# Patient Record
Sex: Male | Born: 1955 | Race: White | Hispanic: No | Marital: Married | Smoking: Former smoker
Health system: Southern US, Community
[De-identification: ages and names within clinical notes are randomized; demographics above are authoritative.]

## PROBLEM LIST (undated history)

## (undated) DIAGNOSIS — E039 Hypothyroidism, unspecified: Secondary | ICD-10-CM

## (undated) DIAGNOSIS — I10 Essential (primary) hypertension: Secondary | ICD-10-CM

## (undated) DIAGNOSIS — G4730 Sleep apnea, unspecified: Secondary | ICD-10-CM

## (undated) DIAGNOSIS — M1990 Unspecified osteoarthritis, unspecified site: Secondary | ICD-10-CM

## (undated) DIAGNOSIS — I499 Cardiac arrhythmia, unspecified: Secondary | ICD-10-CM

## (undated) HISTORY — PX: COLONOSCOPY: SHX174

## (undated) HISTORY — PX: CARDIAC CATHETERIZATION: SHX172

---

## 1993-10-12 HISTORY — PX: KNEE ARTHROSCOPY: SHX127

## 1996-10-12 HISTORY — PX: UVULOPALATOPHARYNGOPLASTY, TONSILLECTOMY AND SEPTOPLASTY: SHX2632

## 1998-10-12 HISTORY — PX: KNEE ARTHROSCOPY: SUR90

## 2003-04-11 ENCOUNTER — Emergency Department (HOSPITAL_COMMUNITY): Admission: EM | Admit: 2003-04-11 | Discharge: 2003-04-12 | Payer: Self-pay | Admitting: Emergency Medicine

## 2003-10-13 HISTORY — PX: SHOULDER ARTHROSCOPY: SHX128

## 2003-11-09 ENCOUNTER — Encounter: Admission: RE | Admit: 2003-11-09 | Discharge: 2003-11-09 | Payer: Self-pay

## 2004-09-02 ENCOUNTER — Encounter: Admission: RE | Admit: 2004-09-02 | Discharge: 2004-09-02 | Payer: Self-pay

## 2005-09-14 ENCOUNTER — Inpatient Hospital Stay (HOSPITAL_COMMUNITY): Admission: EM | Admit: 2005-09-14 | Discharge: 2005-09-19 | Payer: Self-pay | Admitting: Emergency Medicine

## 2005-09-15 ENCOUNTER — Encounter (INDEPENDENT_AMBULATORY_CARE_PROVIDER_SITE_OTHER): Payer: Self-pay | Admitting: *Deleted

## 2005-09-16 ENCOUNTER — Ambulatory Visit: Payer: Self-pay | Admitting: Internal Medicine

## 2005-10-09 ENCOUNTER — Observation Stay (HOSPITAL_COMMUNITY): Admission: EM | Admit: 2005-10-09 | Discharge: 2005-10-10 | Payer: Self-pay | Admitting: Emergency Medicine

## 2005-10-30 ENCOUNTER — Encounter (HOSPITAL_COMMUNITY): Admission: RE | Admit: 2005-10-30 | Discharge: 2006-01-28 | Payer: Self-pay | Admitting: Endocrinology

## 2007-05-31 ENCOUNTER — Ambulatory Visit (HOSPITAL_COMMUNITY): Admission: RE | Admit: 2007-05-31 | Discharge: 2007-05-31 | Payer: Self-pay | Admitting: Orthopedic Surgery

## 2010-06-09 ENCOUNTER — Ambulatory Visit: Payer: Self-pay | Admitting: Diagnostic Radiology

## 2010-06-09 ENCOUNTER — Emergency Department (HOSPITAL_BASED_OUTPATIENT_CLINIC_OR_DEPARTMENT_OTHER): Admission: EM | Admit: 2010-06-09 | Discharge: 2010-06-10 | Payer: Self-pay | Admitting: Emergency Medicine

## 2010-12-26 LAB — DIFFERENTIAL
Basophils Absolute: 0.1 10*3/uL (ref 0.0–0.1)
Basophils Relative: 1 % (ref 0–1)
Eosinophils Absolute: 0.1 10*3/uL (ref 0.0–0.7)
Eosinophils Relative: 1 % (ref 0–5)
Lymphocytes Relative: 13 % (ref 12–46)
Lymphs Abs: 1.1 10*3/uL (ref 0.7–4.0)
Monocytes Absolute: 0.5 10*3/uL (ref 0.1–1.0)
Monocytes Relative: 6 % (ref 3–12)
Neutro Abs: 6.7 10*3/uL (ref 1.7–7.7)
Neutrophils Relative %: 79 % — ABNORMAL HIGH (ref 43–77)

## 2010-12-26 LAB — COMPREHENSIVE METABOLIC PANEL
ALT: 16 U/L (ref 0–53)
AST: 22 U/L (ref 0–37)
Albumin: 4.2 g/dL (ref 3.5–5.2)
Alkaline Phosphatase: 73 U/L (ref 39–117)
BUN: 20 mg/dL (ref 6–23)
CO2: 27 mEq/L (ref 19–32)
Calcium: 9.1 mg/dL (ref 8.4–10.5)
Chloride: 105 mEq/L (ref 96–112)
Creatinine, Ser: 1.2 mg/dL (ref 0.4–1.5)
GFR calc Af Amer: >60 mL/min (ref >60)
GFR calc non Af Amer: >60 mL/min (ref >60)
Glucose, Bld: 140 mg/dL — ABNORMAL HIGH (ref 70–99)
Potassium: 3.9 mEq/L (ref 3.5–5.1)
Sodium: 143 mEq/L (ref 135–145)
Total Bilirubin: 0.7 mg/dL (ref 0.3–1.2)
Total Protein: 7.4 g/dL (ref 6.0–8.3)

## 2010-12-26 LAB — CBC
HCT: 44.7 % (ref 39.0–52.0)
Hemoglobin: 15.5 g/dL (ref 13.0–17.0)
MCH: 32.4 pg (ref 26.0–34.0)
MCHC: 34.7 g/dL (ref 30.0–36.0)
MCV: 93.5 fL (ref 78.0–100.0)
Platelets: 188 10*3/uL (ref 150–400)
RBC: 4.78 MIL/uL (ref 4.22–5.81)
RDW: 12.3 % (ref 11.5–15.5)
WBC: 8.5 10*3/uL (ref 4.0–10.5)

## 2010-12-26 LAB — URINALYSIS, ROUTINE W REFLEX MICROSCOPIC
Bilirubin Urine: NEGATIVE
Glucose, UA: 100 mg/dL — AB
Hgb urine dipstick: NEGATIVE
Ketones, ur: NEGATIVE mg/dL
Nitrite: NEGATIVE
Protein, ur: NEGATIVE mg/dL
Specific Gravity, Urine: 1.023 (ref 1.005–1.030)
Urobilinogen, UA: 0.2 mg/dL (ref 0.0–1.0)
pH: 5 (ref 5.0–8.0)

## 2010-12-26 LAB — LIPASE, BLOOD: Lipase: 39 U/L (ref 23–300)

## 2011-02-27 NOTE — Consult Note (Signed)
NAME:  Theodore Moore, Theodore Moore NO.:  [id number]   MEDICAL RECORD NO.:  [id number]          PATIENT TYPE:  INP   LOCATION:  3708                         FACILITY:  [facility]   PHYSICIAN:  Tera Mater. Evlyn Kanner, M.D. DATE OF BIRTH:  May 07, 1956   DATE OF CONSULTATION:  09/18/2005  DATE OF DISCHARGE:                                   CONSULTATION   CONSULTING PHYSICIAN:  Tera Mater. Evlyn Kanner, M.D.   REQUESTING PHYSICIANS:  1.  Duke Salvia, M.D.  2.  Windy Fast D. Polite, M.D.   Theodore Moore is a 55 year old white male admitted, on September 14, 2005,  with new onset atrial fibrillation with a rapid ventricular response.  He  has had a prior history of dyslipidemia, hypertension, and sleep apnea.  He  never had any thyroid history.  As a part of his workup, his thyroid testing  was done showing a remarkably elevated thyroid function test.  I am asked to  see him for this reason.  Upon questioning, the patient reports a stable  weight for the last 6-12 months.  He does note some tremor of his hands  noted for about the last 6-8 weeks which he attributed to his blood pressure  treatment.  His sleep has been chronically disordered but has not changed  any recently.  He has had no sensation of weakness over his baseline.  He  has been a bit constipated.  He has had no discrete muscle weakness.  He has  had no diplopia or eye irritation.  He does have some chronic phlegm in his  throat causing some swallowing difficulty intermittently but nothing that  sounds like a goiter related swallowing difficulty.  He had no palpitations  prior to this.  His overall status has been good.  He has noted no skin  issues, headaches, or neurologic complaints.   SOCIAL HISTORY:  Reveals he is divorced.  He has not smoked in 25 years.  He  does use occasional alcohol.   FAMILY HISTORY:  Reveals his father died of an MI at 45.  Mother died of an  aneurysm.  A brother committed suicide, another brother had an  automobile  accident.   MEDICATIONS:  Here were Benicar 40 mg, aspirin, and calcium carbonate.  He  also takes Viagra and fish oil.   PHYSICAL EXAMINATION:  VITAL SIGNS:  Currently, blood pressure 117/72, pulse  67, respirations 18, temperature 97.9, sat is 98% on room air.  GENERAL:  We  have a fairly healthy, tanned-appearing, white male lying quietly in bed in  no distress.  HEENT:  Sclerae are anicteric.  There is no lid lag.  No exophthalmus and no  extraocular muscle dysfunction.  Oral mucous membranes are moist.  NECK:  Supple.  I can not appreciate a significant goiter.  No bruits are  heard.  LUNGS:  Clear.  HEART:  Regular with no murmurs appreciated.  ABDOMEN:  Soft, nontender.  I can not feel any spleen or liver enlargement.  EXTREMITIES:  Reveal pulses to be intact.  There is no pretibial skin  changes.  There is no onycholysis  noted of his fingernails.  NEUROLOGIC:  The patient is awake, alert.  His mentation is clear.  His  voice is strong with no hoarseness.  There is a trace resting tremor.   LABORATORY DATA:  White count is 6,000, hemoglobin 15.9, MCV 84.6, platelets  are 228,000.  INR now is 1.7.  His chemistries:  Sodium 140, potassium 4.2,  chloride 107, CO2 16, glucose 107, creatinine 1.0.  Total bili 0.8, alkaline  phosphatase 99, SGOT 26, SGPT elevated at 60, total protein 5.9, albumin  3.5, calcium 9.5.  CK was 57 with an MB of 1.5, troponin was 0.01.  Total  cholesterol was 171, triglycerides 207, HDL 43, LDL of 87.  Free T4 is  significantly high at 3.82 with normal to 1.80.  TSH is depressed at 0.05.  Free T4 is 9.4 with normal range up to 4.2.  Thyroglobulin antibodies are  clearly positive at 102, thyroid peroxidase antibodies 32.6 which is  intermediate.  Thyroid scan is in progress.   SUMMARY:  We have a 55 year old white male presenting with new onset Afib in  the setting of abnormal thyroid function testing.  He has somewhat of a  mixed picture  without significant symptomatology other than tremor for the  last 6-8 weeks.  It is certainly possible that this represents a thyroiditis  that would be self-resolving.  Scan and uptake will be very important as a  diagnostic tool to help [place] decided whether oral antithyroid drugs are  indicated.  The other variable in this equation is that his liver function  testing was mildly elevated and this will be repeated.  We will have to be  more careful with antithyroid drugs if the LFTs indeed remain elevated.  Workup of this will be as clinically indicated by the new numbers.  I do  agree with the anticoagulation.  His risk for recurrent arrhythmia is  relatively high until his thyroid status is improved.  He has no thyroid,  eye, or skin manifestations.  Of note as well is he does have some  dyslipidemia that probably needs routine addressing.           ______________________________  Tera Mater. Evlyn Kanner, M.D.     SAS/MEDQ  D:  09/18/2005  T:  09/18/2005  Job:  [id number]

## 2011-02-27 NOTE — Consult Note (Signed)
NAMEMarland Kitchen  NELS, MUNN NO.:  [id number]   MEDICAL RECORD NO.:  [id number]          PATIENT TYPE:  INP   LOCATION:  2902                         FACILITY:  [facility]   PHYSICIAN:  Theone Stanley, MD   DATE OF BIRTH:  1956/08/15   DATE OF CONSULTATION:  09/15/2005  DATE OF DISCHARGE:                                   CONSULTATION   REASON FOR CONSULTATION:  Evidence of hyperthyroidism.   REQUESTING PHYSICIAN:  Dr. Fraser Din   HISTORY OF PRESENT ILLNESS:  Mr. Dunsworth is a very pleasant 55 year old  gentleman who states on Friday prior to his admission he had an episode of  chest tightness.  In addition, some feelings of hot flashes.  This persisted  throughout the day.  In addition, he states he felt like it was heartburn.  These symptoms seemed to have resolve and he was able to engage in his  normal activities.  However, on Monday he woke up again feeling as if he had  heartburn and overall not feeling well.  He attempted to take his blood  pressure and he has noted that his heart was erratic.  He presented to Dr.  Pablo Lawrence office who referred him to the emergency room.  In the emergency  room it was noted patient was in atrial fibrillation with RVR.  Heart rate  ranging from 150-180.  He was started on a Cardizem drip and a bolus with  quite difficult to control.  Because of this reason cardiology admitted the  patient.  During his work-up it was noted that his TSH was 0.005.  Because  of this reason, cardiology asked [place] to come by and assess him and engage in  further work-up.  Currently, patient states that his symptoms have resolved  and he has no longer any evidence of hot flashes or any heartburn.  He  states that there is no family history of hypothyroidism.  He denies any  change in his skin or hair, any change in his bowel habits, any peripheral  edema.   PAST MEDICAL HISTORY:  1.  Sleep apnea.  Apparently, he is noncompliant.  2.  Hypertension.  3.   Dyslipidemia.  4.  Patient has a history of myalgias.  He was unable to give me a specific      diagnosis.  He sees Dr. Phylliss Bob in regards to this.  He has been on      prednisone for the past year and a half.  He was originally on 10 mg and      now he is on __________ mg daily.   PAST SURGICAL HISTORY:  Left shoulder surgery in 2006.   SOCIAL HISTORY:  Patient lives in [place].  Has been divorced.  Has a  history of tobacco use, stopped 25 years ago.  Drinks about a six-pack every  one to two weeks.  Has one son.  He works as a Curator.   FAMILY HISTORY:  Father passed away from an MI at age 68.  Mother had a  ruptured aneurysm.  He has one brother who committed suicide, one brother  who passed from an automobile accident.  His son also has a history of  myalgias and he had been seeing Dr. Phylliss Bob.  Again, there is no specific  diagnosis for this.   REVIEW OF SYSTEMS:  Please see HPI, otherwise all systems were negative.   PHYSICAL EXAMINATION:  VITAL SIGNS:  Blood pressure 95/65, heart rate of 75-  85, respiratory rate of 15, saturating 97% on room air.  GENERAL:  Very pleasant 55 year old gentleman laying in bed in no distress.  HEENT:  Head normocephalic, atraumatic.  Eyes:  3 mm, pupils reactive to  light.  Extraocular muscles intact.  Ears no discharge.  Throat clear.  No  erythema.  No exudate.  Mucosa moist.  NECK:  Supple.  No lymphadenopathy.  No JVD.  No thyromegaly.  HEART:  Irregularly irregular.  No murmurs, rubs, or gallops appreciated.  LUNGS:  Clear to auscultation bilaterally.  ABDOMEN:  Soft, nontender, nondistended.  EXTREMITIES:  No edema, cyanosis or clubbing.  SKIN:  Patient appeared to have evidence of sun exposure.  When inquired  about this he states that he does visit a tanning bed on occasion.  GENITOURINARY:  Deferred.  NEUROLOGIC:  Patient alert and oriented x3.   LABORATORIES:  TSH 0.005.  White count of 6.5, hemoglobin 17, hematocrit 50,  platelets  289.  Sodium 140, potassium 4.2, chloride 107, CO2 27, glucose  107, BUN 16, creatinine 1.  ALT 60, AST 26, albumin 3.5, calcium 9.5,  magnesium 2.3, total bilirubin 0.8, alkaline phosphatase 99, AST 26.  Cardiac enzymes were negative.  EKG consistent with atrial fibrillation.  Patient did have a brief episode of pause extending out to 5.1 seconds.   ASSESSMENT/PLAN:  Mr. Seier presents with atrial fibrillation with rapid  ventricular response and evidence of hyperthyroidism.  1.  Hyperthyroidism.  Will obtain a free T3 and a free T4.  Based on these      results will determine whether to obtain a thyroid radioiodine uptake      scan.  In addition, would recommend beta blockers versus Cardizem.      Patient has no other symptoms at this point in time, therefore the need      for a PTU is not necessary unless he becomes more symptomatic.  I did      discuss the issue with cardiology and because of his five-second pause      it was felt that they would better be able to control his rate with a      Cardizem drip.  I will leave this up to him to decide if and when to      change to a beta blocker.  2.  Myalgias.  Again, it is unclear why patient is on prednisone for this      myalgia.  There is no specific diagnosis the patient can give me.  He      does see Dr. Phylliss Bob for this and he has been on prednisone for about a      year and a half and this is being weaned down.  I will try to obtain the      records from Dr. Renaldo Reel office in an attempt to see if he has specific      diagnosis that might be related to his hyperthyroidism.      Theone Stanley, MD  Electronically Signed     AEJ/MEDQ  D:  09/15/2005  T:  09/15/2005  Job:  [id number]  cc:   Molly Maduro L. Foy [place], M.D.  Fax: [phone]   Areatha Keas, M.D.  Fax: [phone]

## 2011-02-27 NOTE — H&P (Signed)
NAME:  Theodore Moore, Theodore Moore NO.:  [id number]   MEDICAL RECORD NO.:  [id number]          PATIENT TYPE:  INP   LOCATION:  3714                         FACILITY:  [facility]   PHYSICIAN:  Lyn Records, M.D.   DATE OF BIRTH:  05/08/1956   DATE OF ADMISSION:  10/09/2005  DATE OF DISCHARGE:                                HISTORY & PHYSICAL   REASON FOR ADMISSION:  Atrial fibrillation and atrial flutter.   SUBJECTIVE:  The patient is 55 years of age and has a history of atrial  fibrillation/atrial flutter, recently diagnosed on September 14, 2005.  During  that admission, he was found to be hyperthyroid with question of thyroiditis  versus other.  He was discharged from the hospital before the final  interpretation of his thyroid scan was done, and he was supposed to follow  up with endocrine, but this has been not well accomplished.  He converted  from atrial fibrillation/atrial flutter on IV Cardizem and Lopressor, and  ultimately was seen by Dr. Sherryl Manges because of some post-conversion and  peri-conversion bradyarrhythmias.  He has ultimately been doing well as an  outpatient on Lopressor 50 mg b.i.d., Coumadin 5 mg daily, and he also takes  prednisone 5 mg daily for myalgias.   ALLERGIES:  No known drug allergies.   PAST MEDICAL HISTORY:  1.  Obstructive sleep apnea, on CPAP.  2.  Atrial fibrillation/atrial flutter, diagnosed September 14, 2005.  3.  Hyperthyroidism, probably Graves.  4.  Hyperlipidemia.   FAMILY HISTORY:  Father died of myocardial infarction at age 89.  His mother  died of a ruptured aneurysm, I am not sure where.  He has a brother who has  coronary artery disease and another brother that died of suicide.   REVIEW OF SYSTEMS:  Negative for hyperthyroid symptoms.  Negative neuro  symptoms.  No bleeding on Coumadin.  Myalgias continue.   HABITS:  He does not smoke.  Drinks perhaps a six pack of beer per month.   OBJECTIVE:  VITAL SIGNS:  Blood  pressure 120/60, heart rate 160 and regular,  respirations 16 and not labored.  SKIN:  Warm and dry.  HEENT:  No stare or lid lag.  Extraocular movements are full.  NECK:  No JVD, carotid bruit, or thyromegaly.  CHEST:  Clear.  CARDIAC:  Tachy rhythm.  No murmur or gallop is heard.  EXTREMITIES:  No edema.  NEUROLOGIC:  No tremor or focal cranial nerve deficits.  There is no  exophthalmos.   LABORATORY DATA:  ECG reveals atrial flutter with 2:1 AV conduction.  Chest  x-ray reveals basilar atelectasis, otherwise unremarkable.   INR of 2.3, ALT of 60, otherwise liver tests are normal.  Thyroid scan done  three weeks ago reveals an increased iodine uptake at 57%, consistent by the  interpretation of radiologist with Graves disease.   IMPRESSION:  1.  Hyperthyroidism, untreated, probably related to Graves disease.  2.  Atrial flutter with rapid ventricular response.  3.  Hyperlipidemia.  4.  Sleep apnea.   PLAN:  1.  Intravenous Cardizem.  2.  Intravenous and  oral beta blocker therapy.  3.  Recheck thyroid tests and reconsult endocrine.  4.  Continue Coumadin.  5.  Continue CPAP as at home.      Lyn Records, M.D.  Electronically Signed     HWS/MEDQ  D:  10/09/2005  T:  10/10/2005  Job:  [id number]   cc:   Molly Maduro L. Foy [place], M.D.  Fax: [phone]   Tera Mater. Evlyn Kanner, M.D.  Fax: [phone]   Dr. Vernetta Honey [place] Office

## 2011-02-27 NOTE — H&P (Signed)
NAME:  Theodore Moore, Theodore Moore NO.:  [id number]   MEDICAL RECORD NO.:  [id number]          PATIENT TYPE:  INP   LOCATION:  1824                         FACILITY:  [facility]   PHYSICIAN:  Meade Maw, M.D.    DATE OF BIRTH:  October 27, 1955   DATE OF ADMISSION:  09/14/2005  DATE OF DISCHARGE:                                HISTORY & PHYSICAL   REFERRING PHYSICIAN:  Dr. Marinda Elk   INDICATION FOR CONSULT:  Atrial fibrillation, new onset,  with rapid  ventricular response.   HISTORY:  Theodore Moore is a very pleasant 55 year old male who initially developed  episode of chest tightness on Friday.  This persisted throughout most of the  day.  He states it felt like he had heartburn. Just did not feel well.  He  specifically did not note any tachyarrhythmia, presyncope, or syncope.  The  heartburn eventually resolved.  He had no recurrence over Saturday and  Sunday, did his usual activity which was pretty much sedentary and limited  to walking.  He awoke on Monday a.m., again felt as if he had heartburn and  generally just did not feel well.  Attempted to take his blood pressure and  noted that his heart rate was erratic and subsequently presented to Dr.  Pablo Lawrence office who referred him to the emergency room for further  evaluation.   Upon arrival to the emergency room, he was noted to be in atrial  fibrillation, rapid ventricular response, ventricular rate ranging from 150  to 180.  He was started on Cardizem bolus at 10 mg IV followed by Cardizem  20 mg IV and a drip at 20 mg IV.  He has been difficult to rate control.  His heart rate generally has been into the 120s.  Currently he is without  chest pain.  States that the heartburn has resolved.  He feels something  fluttering in his chest, but still is not quite aware of the  tachyarrhythmia. states things just do not feel right.  He has had no  presyncope, no syncope.  His cardiac risk factors are significant for male,  positive   family history, father massive myocardial infarction at age 32,  dyslipidemia, and hypertension and sleep apnea.   PAST MEDICAL HISTORY:  As noted above.  1.  Sleep apnea.  The patient has been noncompliant with CPAP treatment,      states that he is unable to wear the nasal cannula.  He has full facial      hair and is hesitant remove his facial hair.  He does note that he      continues to have daytime fatigue, wakes in the morning occasionally      with headaches and will often fall asleep at inappropriate times.  He      has had OP3 surgery x3 without improvement in sleep apnea.  2.  Hypertension.  Blood pressure has been well controlled on Benicar.  3.  Dyslipidemia.  His last cholesterol profile has been more than 1 year.      His last TSH was in 1999.   PAST SURGICAL HISTORY:  Significant for OP3 as noted above and left shoulder  surgery in January 2006. Surgery was uncomplicated.   SOCIAL HISTORY:  He has been divorced one year.  History of tobacco use,  stopped smoking 25 years ago.  Drinks up to a six-pack every 1 to 2 weeks,  no significant alcohol intake.  He has one son.  Works as a Curator.   FAMILY HISTORY:  Father passed of myocardial infarction at 23.  Mother died  from ruptured aneurysm.  He has one brother who committed suicide.  One  brother passed from automobile accident.   REVIEW OF SYSTEMS:  Negative for orthopnea, presyncope, palpitations,  tachyarrhythmia, no GI bleed, no unusual bleeding, no dyspnea, no nausea, no  vomiting.   PHYSICAL EXAMINATION:  VITAL SIGNS:  Blood pressure initially 130/84 with  heart rate of 175 to 180.  Current blood pressure 111/70 with a heart rate  of 90 in the 120s to 130s.  HEENT:  There is minimal septal deviation.  Oropharynx significant for the  presence of previous OP3 surgery, absence of uvula, absence of tonsils.  There is no neck vein distention.  There is no carotid bruit.  PULMONARY:  Exam reveals breaths sounds  which are equal and clear to  auscultation.  No use of accessory muscles.  CARDIOVASCULAR: Exam reveals a regular rate and rhythm.  Normal S1 and S2.  No rubs, murmurs, gallops noted.  ABDOMEN:  Soft, benign, nontender.  EXTREMITIES:  No peripheral edema. Skin is warm and dry.  NEUROLOGIC:  Nonfocal.   ECG, as noted above, reveals atrial fibrillation at a rate of 170.   LABORATORY DATA:  Sodium 138, potassium 4.5, BUN 20, glucose 110.  The pH is  7.38, bicarbonate 23.  Hematocrit 54, hemoglobin 18.4, white count 6.5,  platelet count 273.  Myoglobin 71.9, CK-MB 1.2, troponin I less than 0.05.   IMPRESSION:  1.  Atrial fibrillation with rapid ventricular response.  Will initially      attempt rate control and if the patient spontaneously converts back to a      sinus rhythm.  This appears to be new in onset as the patient remains      difficult to rate control.  May need to consider antiarrhythmic therapy      versus a chemical cardioversion.  Currently hemoglobin is quite stable,      without chest pain, and negative enzymes.  2.  Hypertension.  Will discontinue Benicar and will need to use Cardizem      and Lopressor.  Blood pressure adequately controlled.  3.  Dyslipidemia.  Will obtain a statin profile for further evaluation.  4.  Sleep apnea. I have discussed the comorbid conditions associated with      sleep apnea with the patient.  He also appears to have polycythemia      which may be related to his O2 desaturation at night.  Will obtain a      transthoracic echocardiogram for further evaluation.  The patient will      eventually need a stress Cardiolite for further evaluation.  However,      the timing of this will be contingent upon the response of his atrial      fibrillation.      Meade Maw, M.D.  Electronically Signed     HP/MEDQ  D:  09/14/2005  T:  09/14/2005  Job:  [id number]

## 2011-02-27 NOTE — Discharge Summary (Signed)
NAME:  Theodore Moore, Theodore Moore NO.:  [id number]   MEDICAL RECORD NO.:  [id number]          PATIENT TYPE:  INP   LOCATION:  3708                         FACILITY:  [facility]   PHYSICIAN:  Armanda Magic, M.D.     DATE OF BIRTH:  25-Apr-1956   DATE OF ADMISSION:  09/14/2005  DATE OF DISCHARGE:  09/19/2005                                 DISCHARGE SUMMARY   PRIMARY CARE PHYSICIAN:  Robert L. Foy [place], M.D.   CONSULTATIONS:  Dr. Jasmine Pang of [facility] Service and Dr. Adrian Prince of endocrinology.   CHIEF COMPLAINT/REASON FOR ADMISSION:  Mr. Whittley is a 55 year old male  who presented to the ER with complaints of chest discomfort and tightness  lasting for several days. No other symptoms such as heart racing, dizziness,  or near syncope. In the ER he was found to be in atrial fibrillation with  ventricular rates of 150 to 180.  He was given IV Cardizem with rates down  to the 120s. He was without chest pain upon Dr. Lindell Spar evaluation. In the  ER he was noted to have some fluttering in his chest, stating mainly he just  does not feel right. No dizziness. He does have a significant family history  of premature coronary artery disease with father dying at age 54 of MI.   In the ER the patient's vital signs were stable except for tachycardia. EKG  showed atrial fibrillation with a ventricular of 170. Admitting labs were  within normal limits. Her hemoglobin elevated at 11.4, hematocrit 54. Point  of care enzymes times one were negative.   The patient was admitted by Dr. Fraser Din with the following diagnoses:  1.  Atrial fibrillation with a rapid ventricular response.  2.  Chest tightness. Rule out ischemia.  3.  Hypertension.  4.  Dyslipidemia.  5.  Sleep apnea.   HOSPITAL COURSE:  The patient was admitted to the telemetry unit. He was  started on IV Cardizem for rate control, his usual Benicar for hypertension  was discontinued, and he was also started on Lopressor  for additional rate  control. Serial cardiac isoenzymes were checked as well as a TSH. TSH was  found to be subnormal at 0.005.  In addition, over the first 24 hours of  admission the patient did have a 5.19 second pause junctional escape rhythm  with atrial fibrillation and atrial flutter. Within the first 24 hours the  patient was maintaining sinus rhythm. Because of the pause Dr. Fraser Din opted  to hold the Lopressor for now. Because of the hyperthyroidism, internal  medicine hospitalist was consulted. Because of the bradyarrhythmia Dr. Graciela Husbands  was consulted for a second opinion.  He had several thoughts of the issue.  Most of the treatment would be based on whether they felt that the  hyperthyroidism was significant contributing etiology to the tachycardia and  atrial dysrhythmia, with one option to be to wait to see if tachyarrhythmia  resolves with the treatment of the hyperthyroidism. Otherwise, continue on  beta blocker therapy, possibly __________ pacemaker based on patient's  response to rhythm and rate control. He stated  he would be okay to resume  the beta blocker in the morning and begin Coumadin anticoagulation. Please  refer to the dictated note for additional details.   The patient continued to maintain sinus rhythm. Blood pressure was well  controlled. Heart rate was in the 60s and 70s. QTC 406 milliseconds.  Subsequently Cardizem was discontinued and the patient was started back on  Lopressor. Dr. Fraser Din had questions regarding whether the patient would  need to be on long-term Coumadin. This was answered by Dr. Graciela Husbands who stated  in his  notes that Coumadin until euthyroid, then continue for at least six  months to make sure arrhythmia does not recur, noting that he would like to  avoid long-term use of this medication given the presumably reversible  nature of this problem, i.e., secondary to hyperthyroidism.   In regards to his hyperthyroidism, internal medicine service  assisted [place]  with this, recommended taking thyroid uptake scan, with treatment  recommendations based on findings of the scan. Also, Dr. Evlyn Kanner of endocrine  was consulted to assist with management of the hyperthyroidism, given the  fact that the patient may need to be on PTU at some point and follow up more  closely as an outpatient.   The patient was continued on Lovenox while Coumadin was loading and INR was  waiting to become therapeutic. In regards to his dysrhythmia, he had no  further pauses and no further episodes of paroxysmal atrial fibrillation. He  maintained sinus rhythm. Dr. Rinaldo Cloud recommendations treatment of thyroid  disease would be based on the outcome of the thyroid scan if low uptake.  Beta blocker observed as high uptake. Recheck LFTs  and consider Tapazole  therapy. If LFTs still up may need radioactive iodine treatment.   From a cardiac standpoint, on September 19, 2005, the patient was deemed  appropriate for discharge home. Vital signs were stable, heart rate 57 on  Lopressor. The patient had mild elevation in LFTs with an ALT of 143 and an  AST of 38. His INR was nontherapeutic at 2.2. Results of the thyroid uptake  scan were pending. The patient does  have an appointment set up to see Dr.  Evlyn Kanner in one week.   Because the patient was experiencing some chest pain prior to admission, it  was felt that he would need an ischemic workup as an outpatient once he was  euthyroid. Initial EKG and additional EKGs have been negative for any acute  ischemic findings and he has had no further episodes of chest pain while  hospitalized.   FINAL DIAGNOSES:  1.  Paroxysmal atrial fibrillation with rapid ventricular response,      resolved.  2.  Sinus rhythm, sinus bradycardia.  3.  Hyperthyroidism. Thyroid uptake scan results pending.  4.  Mild transaminitis. Additional workup per Dr. Evlyn Kanner.  5.  Systemic anticoagulation for paroxysmal atrial fibrillation. 6.  Heartburn,  chest tightness. Ischemic workup pending as an outpatient.   DISCHARGE MEDICATIONS:  1.  Benicar 40 mg daily, stop this medication.  2.  Aspirin 81 mg daily.  3.  Coumadin 5 mg one tablet daily.  4.  Metoprolol 50 mg b.i.d.  5.  Prednisone 5 mg daily.  6.  Centrum Silver daily.   DIET:  Heart healthy.   ACTIVITY:  Increase activity slowly.   FOLLOWUP:  He will see Dr. Fraser Din in the office, on December 22nd at 9 a.m.  He is to be seen in the Coumadin Clinic at [facility]  on September 21, 2005. He needs to call to get an appointment for that day. In addition he  needs to make an appointment with Dr. Evlyn Kanner to be seen in one week to  discuss the results of his thyroid uptake scan.      Allison L. Ulla Gallo, M.D.  Electronically Signed    ALE/MEDQ  D:  10/30/2005  T:  10/30/2005  Job:  [id number]   cc:   Meade Maw, M.D.  Fax: [phone]   Theone Stanley, MD   Tera Mater Evlyn Kanner, M.D.  Fax: [phone]

## 2014-09-14 ENCOUNTER — Encounter (HOSPITAL_BASED_OUTPATIENT_CLINIC_OR_DEPARTMENT_OTHER): Payer: Self-pay | Admitting: *Deleted

## 2014-09-14 NOTE — Progress Notes (Signed)
   09/14/14 1654  [place]  Have you ever been diagnosed with sleep apnea through a sleep study? Yes  If yes, do you have and use a CPAP or BPAP machine every night? 0  Do you snore loudly (loud enough to be heard through closed doors)?  0  Do you often feel tired, fatigued, or sleepy during the daytime? 0  Has anyone observed you stop breathing during your sleep? 0  Do you have, or are you being treated for high blood pressure? 1  BMI more than 35 kg/m2? 0  Age over 43 years old? 1  Neck circumference greater than 40 cm/16 inches? 1  Gender: 1  Obstructive Sleep Apnea Score 4  Score 4 or greater  Results sent to PCP

## 2014-09-14 NOTE — Progress Notes (Signed)
Pt had AF 2006-went to er-saw dr smith-evaluated thyroid, was cause of problems-has not had anymore heart arrhythmia, Will come in for ekg-bmet

## 2014-09-17 ENCOUNTER — Encounter (HOSPITAL_BASED_OUTPATIENT_CLINIC_OR_DEPARTMENT_OTHER)
Admission: RE | Admit: 2014-09-17 | Discharge: 2014-09-17 | Disposition: A | Discharge status: Home / Self Care | Payer: 59 | Source: Ambulatory Visit | Attending: Orthopedic Surgery | Admitting: Orthopedic Surgery

## 2014-09-17 DIAGNOSIS — M7542 Impingement syndrome of left shoulder: Secondary | ICD-10-CM | POA: Diagnosis not present

## 2014-09-17 DIAGNOSIS — I10 Essential (primary) hypertension: Secondary | ICD-10-CM | POA: Diagnosis not present

## 2014-09-17 DIAGNOSIS — G4730 Sleep apnea, unspecified: Secondary | ICD-10-CM | POA: Diagnosis not present

## 2014-09-17 DIAGNOSIS — E039 Hypothyroidism, unspecified: Secondary | ICD-10-CM | POA: Diagnosis not present

## 2014-09-17 DIAGNOSIS — M1990 Unspecified osteoarthritis, unspecified site: Secondary | ICD-10-CM | POA: Diagnosis not present

## 2014-09-17 DIAGNOSIS — M75122 Complete rotator cuff tear or rupture of left shoulder, not specified as traumatic: Secondary | ICD-10-CM | POA: Diagnosis present

## 2014-09-17 LAB — BASIC METABOLIC PANEL
Anion gap: 11 (ref 5–15)
BUN: 28 mg/dL — ABNORMAL HIGH (ref 6–23)
CALCIUM: 9.2 mg/dL (ref 8.4–10.5)
CO2: 27 mEq/L (ref 19–32)
CREATININE: 1.16 mg/dL (ref 0.50–1.35)
Chloride: 101 mEq/L (ref 96–112)
GFR calc Af Amer: 78 mL/min — ABNORMAL LOW (ref >90)
GFR, EST NON AFRICAN AMERICAN: 68 mL/min — AB (ref >90)
GLUCOSE: 125 mg/dL — AB (ref 70–99)
Potassium: 4.9 mEq/L (ref 3.7–5.3)
SODIUM: 139 meq/L (ref 137–147)

## 2014-09-17 NOTE — Progress Notes (Signed)
BUN result of 28 reviewed with Dr. Al Corpus; is OK for surgery.

## 2014-09-18 ENCOUNTER — Ambulatory Visit: Payer: Self-pay | Admitting: Physician Assistant

## 2014-09-18 NOTE — H&P (Signed)
Theodore Moore is an 58 y.o. male.   Chief Complaint: left shoulder pain HPI: This is a chronic problem non-traumatic left shoulder. He had previous arthroscopy a decade ago. We saw him for his right shoulder he is taking medicine left over from other things and has 4 tablets left he is splitting it in half at night. He does physical work. Pain is described as 6-7 baseline going up to 8-9/10 with activities. He does physical work.  We opted to obtain MRI which shows recurrent rotator cuff tear partial vs possible full thickness without retraction.    Past Medical History  Diagnosis Date  . Hypertension   . Dysrhythmia     hx af when thyroid was abnormal-2006  . Hypothyroidism   . Arthritis   . Sleep apnea     used to use cpap-not anymore    Past Surgical History  Procedure Laterality Date  . Shoulder arthroscopy  2005    right and left  . Knee arthroscopy  1995    right  . Knee arthroscopy  2000    left  . Uvulopalatopharyngoplasty, tonsillectomy and septoplasty  1998  . Colonoscopy      No family history on file. Social History:  reports that he quit smoking about 20 years ago. He does not have any smokeless tobacco history on file. He reports that he drinks alcohol. He reports that he does not use illicit drugs.  Allergies: No Known Allergies   (Not in a hospital admission)  Results for orders placed or performed during the hospital encounter of 09/19/14 (from the past 48 hour(s))  Basic metabolic panel     Status: Abnormal   Collection Time: 09/17/14  8:35 AM  Result Value Ref Range   Sodium 139 137 - 147 mEq/L   Potassium 4.9 3.7 - 5.3 mEq/L   Chloride 101 96 - 112 mEq/L   CO2 27 19 - 32 mEq/L   Glucose, Bld 125 (H) 70 - 99 mg/dL   BUN 28 (H) 6 - 23 mg/dL   Creatinine, Ser 1.16 0.50 - 1.35 mg/dL   Calcium 9.2 8.4 - 10.5 mg/dL   GFR calc non Af Amer 68 (L) >90 mL/min   GFR calc Af Amer 78 (L) >90 mL/min    Comment: (NOTE) The eGFR has been calculated using  the CKD EPI equation. This calculation has not been validated in all clinical situations. eGFR's persistently <90 mL/min signify possible Chronic Kidney Disease.    Anion gap 11 5 - 15   No results found.  Review of Systems  Constitutional: Negative.   HENT: Negative.   Eyes: Negative.   Respiratory: Negative.   Cardiovascular: Negative.   Gastrointestinal: Negative.   Genitourinary: Negative.   Musculoskeletal: Positive for joint pain.  Skin: Negative.   Neurological: Negative.   Endo/Heme/Allergies: Negative.   Psychiatric/Behavioral: Negative.     There were no vitals taken for this visit. Physical Exam  Constitutional: He is oriented to person, place, and time. He appears well-developed and well-nourished.  HENT:  Head: Normocephalic and atraumatic.  Nose: Nose normal.  Eyes: Conjunctivae and EOM are normal. Pupils are equal, round, and reactive to light.  Neck: Normal range of motion. Neck supple.  Cardiovascular: Normal rate and intact distal pulses.   Respiratory: Effort normal. No respiratory distress.  GI: Soft. He exhibits no distension. There is no tenderness.  Musculoskeletal:       Left shoulder: He exhibits decreased range of motion, tenderness, bony tenderness, pain   and decreased strength.  Neurological: He is alert and oriented to person, place, and time.  Skin: Skin is warm and dry. No erythema.  Psychiatric: He has a normal mood and affect. His behavior is normal.     Assessment/Plan Left shoulder recurrent rotator cuff tear  We discussed risks and benefits of outpatient surgery left shoulder arthroscopy debridement possible revision acromioplasty, distal clavicle excision, rotator cuff repair patient wishes to proceed.  General ansthesia IS block.    Chriss Czar 09/18/2014, 1:03 PM

## 2014-09-19 ENCOUNTER — Encounter (HOSPITAL_BASED_OUTPATIENT_CLINIC_OR_DEPARTMENT_OTHER)
Admission: RE | Disposition: A | Discharge status: Home / Self Care | Payer: Self-pay | Source: Ambulatory Visit | Attending: Orthopedic Surgery

## 2014-09-19 ENCOUNTER — Encounter (HOSPITAL_BASED_OUTPATIENT_CLINIC_OR_DEPARTMENT_OTHER): Payer: Self-pay | Admitting: Anesthesiology

## 2014-09-19 ENCOUNTER — Ambulatory Visit (HOSPITAL_BASED_OUTPATIENT_CLINIC_OR_DEPARTMENT_OTHER): Payer: 59 | Admitting: Anesthesiology

## 2014-09-19 ENCOUNTER — Ambulatory Visit (HOSPITAL_BASED_OUTPATIENT_CLINIC_OR_DEPARTMENT_OTHER)
Admission: RE | Admit: 2014-09-19 | Discharge: 2014-09-20 | Disposition: A | Discharge status: Home / Self Care | Payer: 59 | Source: Ambulatory Visit | Attending: Orthopedic Surgery | Admitting: Orthopedic Surgery

## 2014-09-19 DIAGNOSIS — E039 Hypothyroidism, unspecified: Secondary | ICD-10-CM | POA: Insufficient documentation

## 2014-09-19 DIAGNOSIS — M7542 Impingement syndrome of left shoulder: Secondary | ICD-10-CM | POA: Insufficient documentation

## 2014-09-19 DIAGNOSIS — M75122 Complete rotator cuff tear or rupture of left shoulder, not specified as traumatic: Secondary | ICD-10-CM | POA: Insufficient documentation

## 2014-09-19 DIAGNOSIS — G4730 Sleep apnea, unspecified: Secondary | ICD-10-CM | POA: Insufficient documentation

## 2014-09-19 DIAGNOSIS — I10 Essential (primary) hypertension: Secondary | ICD-10-CM | POA: Insufficient documentation

## 2014-09-19 DIAGNOSIS — M1990 Unspecified osteoarthritis, unspecified site: Secondary | ICD-10-CM | POA: Insufficient documentation

## 2014-09-19 HISTORY — DX: Sleep apnea, unspecified: G47.30

## 2014-09-19 HISTORY — DX: Essential (primary) hypertension: I10

## 2014-09-19 HISTORY — DX: Hypothyroidism, unspecified: E03.9

## 2014-09-19 HISTORY — DX: Cardiac arrhythmia, unspecified: I49.9

## 2014-09-19 HISTORY — DX: Unspecified osteoarthritis, unspecified site: M19.90

## 2014-09-19 HISTORY — PX: SHOULDER ARTHROSCOPY WITH OPEN ROTATOR CUFF REPAIR AND DISTAL CLAVICLE ACROMINECTOMY: SHX5683

## 2014-09-19 LAB — POCT HEMOGLOBIN-HEMACUE: HEMOGLOBIN: 14.5 g/dL (ref 13.0–17.0)

## 2014-09-19 SURGERY — SHOULDER ARTHROSCOPY WITH OPEN ROTATOR CUFF REPAIR AND DISTAL CLAVICLE ACROMINECTOMY
Anesthesia: General | Site: Shoulder | Laterality: Left

## 2014-09-19 MED ORDER — DEXAMETHASONE SODIUM PHOSPHATE 4 MG/ML IJ SOLN
INTRAMUSCULAR | Status: DC | PRN
  Administered 2014-09-19: 10 mg via INTRAVENOUS

## 2014-09-19 MED ORDER — ONDANSETRON HCL 4 MG PO TABS: 4.0000 mg | ORAL_TABLET | Freq: Four times a day (QID) | ORAL | Status: DC | PRN

## 2014-09-19 MED ORDER — HYDROMORPHONE HCL 1 MG/ML IJ SOLN
0.2500 mg | INTRAMUSCULAR | Status: DC | PRN
  Administered 2014-09-19 (×2): 0.25 mg via INTRAVENOUS

## 2014-09-19 MED ORDER — LACTATED RINGERS IV SOLN
INTRAVENOUS | Status: DC
  Administered 2014-09-19 (×2): via INTRAVENOUS

## 2014-09-19 MED ORDER — FENTANYL CITRATE 0.05 MG/ML IJ SOLN
INTRAMUSCULAR | Status: AC
  Filled 2014-09-19: qty 6

## 2014-09-19 MED ORDER — SUCCINYLCHOLINE CHLORIDE 20 MG/ML IJ SOLN
INTRAMUSCULAR | Status: DC | PRN
  Administered 2014-09-19: 50 mg via INTRAVENOUS

## 2014-09-19 MED ORDER — FENTANYL CITRATE 0.05 MG/ML IJ SOLN
INTRAMUSCULAR | Status: AC
  Filled 2014-09-19: qty 2

## 2014-09-19 MED ORDER — PROPOFOL 10 MG/ML IV BOLUS
INTRAVENOUS | Status: DC | PRN
  Administered 2014-09-19: 200 mg via INTRAVENOUS

## 2014-09-19 MED ORDER — METHOCARBAMOL 500 MG PO TABS
500.0000 mg | ORAL_TABLET | Freq: Four times a day (QID) | ORAL | Status: DC | PRN
  Filled 2014-09-19: qty 1

## 2014-09-19 MED ORDER — HYDROMORPHONE HCL 1 MG/ML IJ SOLN
INTRAMUSCULAR | Status: AC
  Filled 2014-09-19: qty 1

## 2014-09-19 MED ORDER — METOCLOPRAMIDE HCL 5 MG PO TABS: 5.0000 mg | ORAL_TABLET | Freq: Three times a day (TID) | ORAL | Status: DC | PRN

## 2014-09-19 MED ORDER — ONDANSETRON HCL 4 MG/2ML IJ SOLN
INTRAMUSCULAR | Status: DC | PRN
  Administered 2014-09-19: 4 mg via INTRAVENOUS

## 2014-09-19 MED ORDER — BUPIVACAINE-EPINEPHRINE (PF) 0.5% -1:200000 IJ SOLN
INTRAMUSCULAR | Status: DC | PRN
  Administered 2014-09-19: 23 mL via PERINEURAL

## 2014-09-19 MED ORDER — DOCUSATE SODIUM 100 MG PO CAPS
100.0000 mg | ORAL_CAPSULE | Freq: Two times a day (BID) | ORAL | Status: DC
  Administered 2014-09-19: 100 mg via ORAL
  Filled 2014-09-19: qty 1

## 2014-09-19 MED ORDER — MIDAZOLAM HCL 2 MG/2ML IJ SOLN
INTRAMUSCULAR | Status: AC
  Filled 2014-09-19: qty 2

## 2014-09-19 MED ORDER — METOCLOPRAMIDE HCL 5 MG/ML IJ SOLN: 5.0000 mg | Freq: Three times a day (TID) | INTRAMUSCULAR | Status: DC | PRN

## 2014-09-19 MED ORDER — OXYCODONE-ACETAMINOPHEN 5-325 MG PO TABS
1.0000 | ORAL_TABLET | ORAL | Status: DC | PRN
  Administered 2014-09-19 – 2014-09-20 (×5): 2 via ORAL
  Filled 2014-09-19 (×5): qty 2

## 2014-09-19 MED ORDER — SODIUM CHLORIDE 0.9 % IR SOLN
Status: DC | PRN
  Administered 2014-09-19: 6000 mL

## 2014-09-19 MED ORDER — CEFAZOLIN SODIUM-DEXTROSE 2-3 GM-% IV SOLR: 2.0000 g | INTRAVENOUS | Status: AC

## 2014-09-19 MED ORDER — CHLORHEXIDINE GLUCONATE 4 % EX LIQD: 60.0000 mL | Freq: Once | CUTANEOUS | Status: DC

## 2014-09-19 MED ORDER — EPHEDRINE SULFATE 50 MG/ML IJ SOLN
INTRAMUSCULAR | Status: DC | PRN
  Administered 2014-09-19: 10 mg via INTRAVENOUS

## 2014-09-19 MED ORDER — FENTANYL CITRATE 0.05 MG/ML IJ SOLN
50.0000 ug | INTRAMUSCULAR | Status: DC | PRN
Start: 2014-09-19 — End: 2014-09-20
  Administered 2014-09-19: 100 ug via INTRAVENOUS

## 2014-09-19 MED ORDER — HYDROMORPHONE HCL 1 MG/ML IJ SOLN: 0.5000 mg | INTRAMUSCULAR | Status: DC | PRN

## 2014-09-19 MED ORDER — SODIUM CHLORIDE 0.9 % IV SOLN: INTRAVENOUS | Status: DC

## 2014-09-19 MED ORDER — METHOCARBAMOL 500 MG PO TABS: 500.0000 mg | ORAL_TABLET | Freq: Four times a day (QID) | ORAL | Status: DC

## 2014-09-19 MED ORDER — OXYCODONE HCL 5 MG PO TABS: 5.0000 mg | ORAL_TABLET | Freq: Once | ORAL | Status: AC | PRN

## 2014-09-19 MED ORDER — SODIUM CHLORIDE 0.9 % IV SOLN
INTRAVENOUS | Status: DC
  Administered 2014-09-19 (×2): via INTRAVENOUS

## 2014-09-19 MED ORDER — CEFAZOLIN SODIUM-DEXTROSE 2-3 GM-% IV SOLR
INTRAVENOUS | Status: DC | PRN
  Administered 2014-09-19: 2 g via INTRAVENOUS

## 2014-09-19 MED ORDER — ONDANSETRON HCL 4 MG/2ML IJ SOLN: 4.0000 mg | Freq: Four times a day (QID) | INTRAMUSCULAR | Status: DC | PRN

## 2014-09-19 MED ORDER — FENTANYL CITRATE 0.05 MG/ML IJ SOLN: INTRAMUSCULAR | Status: DC | PRN

## 2014-09-19 MED ORDER — OXYCODONE HCL 5 MG/5ML PO SOLN: 5.0000 mg | Freq: Once | ORAL | Status: AC | PRN

## 2014-09-19 MED ORDER — DEXTROSE 5 % IV SOLN: 500.0000 mg | Freq: Four times a day (QID) | INTRAVENOUS | Status: DC | PRN

## 2014-09-19 MED ORDER — MIDAZOLAM HCL 2 MG/2ML IJ SOLN
1.0000 mg | INTRAMUSCULAR | Status: DC | PRN
  Administered 2014-09-19: 2 mg via INTRAVENOUS

## 2014-09-19 MED ORDER — LIDOCAINE HCL (CARDIAC) 20 MG/ML IV SOLN
INTRAVENOUS | Status: DC | PRN
  Administered 2014-09-19: 80 mg via INTRAVENOUS

## 2014-09-19 MED ORDER — ONDANSETRON HCL 4 MG/2ML IJ SOLN: 4.0000 mg | Freq: Once | INTRAMUSCULAR | Status: AC | PRN

## 2014-09-19 MED ORDER — OXYCODONE-ACETAMINOPHEN 5-325 MG PO TABS: 1.0000 | ORAL_TABLET | ORAL | Status: DC | PRN

## 2014-09-19 SURGICAL SUPPLY — 91 items
ANCH SUT 2 FT CRKSCW 14.7 STRL (Anchor) ×3 IMPLANT
ANCHOR CORKSCREW BIO 5.5 FT (Anchor) ×6 IMPLANT
ANCHOR CORKSCREW BIO 6.5 W/SUT (Anchor) IMPLANT
APL SKNCLS STERI-STRIP NONHPOA (GAUZE/BANDAGES/DRESSINGS) ×1
BENZOIN TINCTURE PRP APPL 2/3 (GAUZE/BANDAGES/DRESSINGS) ×2 IMPLANT
BLADE 4.2CUDA (BLADE) ×3 IMPLANT
BLADE AVERAGE 25MMX9MM (BLADE) ×1
BLADE AVERAGE 25X9 (BLADE) ×1 IMPLANT
BLADE CUTTER GATOR 3.5 (BLADE) IMPLANT
BLADE SURG 10 STRL SS (BLADE) IMPLANT
BLADE SURG 15 STRL LF DISP TIS (BLADE) IMPLANT
BLADE SURG 15 STRL SS (BLADE) ×3
BLADE VORTEX 6.0 (BLADE) IMPLANT
BUR 3.5 LG SPHERICAL (BURR) IMPLANT
BUR EGG 3PK/BX (BURR) ×2 IMPLANT
BUR OVAL 4.0 (BURR) IMPLANT
BUR OVAL 6.0 (BURR) ×3 IMPLANT
BUR VERTEX HOODED 4.5 (BURR) IMPLANT
BURR 3.5 LG SPHERICAL (BURR)
BURR 3.5MM LG SPHERICAL (BURR)
CANISTER SUCT 3000ML (MISCELLANEOUS) IMPLANT
CANNULA SHOULDER 7CM (CANNULA) ×3 IMPLANT
CANNULA TWIST IN 8.25X7CM (CANNULA) IMPLANT
CLEANER CAUTERY TIP 5X5 PAD (MISCELLANEOUS) IMPLANT
CLOSURE WOUND 1/2 X4 (GAUZE/BANDAGES/DRESSINGS) ×1
CUTTER MENISCUS  4.2MM (BLADE)
CUTTER MENISCUS 4.2MM (BLADE) IMPLANT
DECANTER SPIKE VIAL GLASS SM (MISCELLANEOUS) IMPLANT
DRAPE STERI 35X30 U-POUCH (DRAPES) ×3 IMPLANT
DRAPE SURG 17X23 STRL (DRAPES) ×3 IMPLANT
DRAPE U-SHAPE 76X120 STRL (DRAPES) ×6 IMPLANT
DRSG EMULSION OIL 3X3 NADH (GAUZE/BANDAGES/DRESSINGS) IMPLANT
DRSG PAD ABDOMINAL 8X10 ST (GAUZE/BANDAGES/DRESSINGS) ×3 IMPLANT
DURAPREP 26ML APPLICATOR (WOUND CARE) ×3 IMPLANT
ELECT REM PT RETURN 9FT ADLT (ELECTROSURGICAL) ×3
ELECTRODE REM PT RTRN 9FT ADLT (ELECTROSURGICAL) ×1 IMPLANT
GAUZE SPONGE 4X4 12PLY STRL (GAUZE/BANDAGES/DRESSINGS) ×3 IMPLANT
GLOVE BIO SURGEON STRL SZ7.5 (GLOVE) ×3 IMPLANT
GLOVE BIOGEL M STRL SZ7.5 (GLOVE) ×2 IMPLANT
GLOVE BIOGEL PI IND STRL 7.0 (GLOVE) IMPLANT
GLOVE BIOGEL PI IND STRL 8 (GLOVE) ×2 IMPLANT
GLOVE BIOGEL PI IND STRL 8.5 (GLOVE) IMPLANT
GLOVE BIOGEL PI INDICATOR 7.0 (GLOVE) ×2
GLOVE BIOGEL PI INDICATOR 8 (GLOVE) ×6
GLOVE BIOGEL PI INDICATOR 8.5 (GLOVE)
GLOVE SURG ORTHO 8.0 STRL STRW (GLOVE) ×3 IMPLANT
GOWN STRL REUS W/ TWL LRG LVL3 (GOWN DISPOSABLE) ×1 IMPLANT
GOWN STRL REUS W/ TWL XL LVL3 (GOWN DISPOSABLE) ×1 IMPLANT
GOWN STRL REUS W/TWL LRG LVL3 (GOWN DISPOSABLE) ×3
GOWN STRL REUS W/TWL XL LVL3 (GOWN DISPOSABLE) ×12 IMPLANT
MANIFOLD NEPTUNE II (INSTRUMENTS) ×3 IMPLANT
NDL 1/2 CIR CATGUT .05X1.09 (NEEDLE) IMPLANT
NDL SCORPION MULTI FIRE (NEEDLE) IMPLANT
NEEDLE 1/2 CIR CATGUT .05X1.09 (NEEDLE) ×3 IMPLANT
NEEDLE SCORPION MULTI FIRE (NEEDLE) ×3 IMPLANT
NS IRRIG 1000ML POUR BTL (IV SOLUTION) ×3 IMPLANT
PACK ARTHROSCOPY DSU (CUSTOM PROCEDURE TRAY) ×3 IMPLANT
PACK BASIN DAY SURGERY FS (CUSTOM PROCEDURE TRAY) ×3 IMPLANT
PAD CLEANER CAUTERY TIP 5X5 (MISCELLANEOUS) ×2
PAD ORTHO SHOULDER 7X19 LRG (SOFTGOODS) ×2 IMPLANT
PENCIL BUTTON HOLSTER BLD 10FT (ELECTRODE) ×2 IMPLANT
SET ARTHROSCOPY TUBING (MISCELLANEOUS) ×3
SET ARTHROSCOPY TUBING LN (MISCELLANEOUS) ×1 IMPLANT
SLING ARM LRG ADULT FOAM STRAP (SOFTGOODS) IMPLANT
SLING ARM MED ADULT FOAM STRAP (SOFTGOODS) IMPLANT
SLING ULTRA II MEDIUM (SOFTGOODS) IMPLANT
SLING ULTRA II SMALL (SOFTGOODS) IMPLANT
SPONGE LAP 4X18 X RAY DECT (DISPOSABLE) ×2 IMPLANT
STAPLER VISISTAT 35W (STAPLE) IMPLANT
STRIP CLOSURE SKIN 1/2X4 (GAUZE/BANDAGES/DRESSINGS) ×1 IMPLANT
SUCTION FRAZIER TIP 10 FR DISP (SUCTIONS) ×2 IMPLANT
SUT BONE WAX W31G (SUTURE) IMPLANT
SUT ETHIBOND 2 OS 4 DA (SUTURE) IMPLANT
SUT ETHILON 4 0 PS 2 18 (SUTURE) ×3 IMPLANT
SUT FIBERWIRE #2 38 T-5 BLUE (SUTURE) ×3
SUT MNCRL AB 3-0 PS2 18 (SUTURE) ×3 IMPLANT
SUT PROLENE 3 0 PS 2 (SUTURE) IMPLANT
SUT TICRON 1 T 12 (SUTURE) ×4 IMPLANT
SUT TIGER TAPE 7 IN WHITE (SUTURE) IMPLANT
SUT VIC AB 0 CT1 27 (SUTURE)
SUT VIC AB 0 CT1 27XBRD ANBCTR (SUTURE) IMPLANT
SUT VIC AB 1 CT1 27 (SUTURE)
SUT VIC AB 1 CT1 27XBRD ANBCTR (SUTURE) IMPLANT
SUT VIC AB 2-0 PS2 27 (SUTURE) IMPLANT
SUT VIC AB 2-0 SH 27 (SUTURE) ×3
SUT VIC AB 2-0 SH 27XBRD (SUTURE) IMPLANT
SUTURE FIBERWR #2 38 T-5 BLUE (SUTURE) IMPLANT
TOWEL OR 17X24 6PK STRL BLUE (TOWEL DISPOSABLE) ×3 IMPLANT
WAND STAR VAC 90 (SURGICAL WAND) ×2 IMPLANT
WATER STERILE IRR 1000ML POUR (IV SOLUTION) ×3 IMPLANT
YANKAUER SUCT BULB TIP NO VENT (SUCTIONS) ×2 IMPLANT

## 2014-09-19 NOTE — H&P (View-Only) (Signed)
Theodore Moore is an 58 y.o. male.   Chief Complaint: left shoulder pain HPI: This is a chronic problem non-traumatic left shoulder. He had previous arthroscopy a decade ago. We saw him for his right shoulder he is taking medicine left over from other things and has 4 tablets left he is splitting it in half at night. He does physical work. Pain is described as 6-7 baseline going up to 8-9/10 with activities. He does physical work.  We opted to obtain MRI which shows recurrent rotator cuff tear partial vs possible full thickness without retraction.    Past Medical History  Diagnosis Date  . Hypertension   . Dysrhythmia     hx af when thyroid was abnormal-2006  . Hypothyroidism   . Arthritis   . Sleep apnea     used to use cpap-not anymore    Past Surgical History  Procedure Laterality Date  . Shoulder arthroscopy  2005    right and left  . Knee arthroscopy  1995    right  . Knee arthroscopy  2000    left  . Uvulopalatopharyngoplasty, tonsillectomy and septoplasty  1998  . Colonoscopy      No family history on file. Social History:  reports that he quit smoking about 20 years ago. He does not have any smokeless tobacco history on file. He reports that he drinks alcohol. He reports that he does not use illicit drugs.  Allergies: No Known Allergies   (Not in a hospital admission)  Results for orders placed or performed during the hospital encounter of 09/19/14 (from the past 48 hour(s))  Basic metabolic panel     Status: Abnormal   Collection Time: 09/17/14  8:35 AM  Result Value Ref Range   Sodium 139 137 - 147 mEq/L   Potassium 4.9 3.7 - 5.3 mEq/L   Chloride 101 96 - 112 mEq/L   CO2 27 19 - 32 mEq/L   Glucose, Bld 125 (H) 70 - 99 mg/dL   BUN 28 (H) 6 - 23 mg/dL   Creatinine, Ser 1.16 0.50 - 1.35 mg/dL   Calcium 9.2 8.4 - 10.5 mg/dL   GFR calc non Af Amer 68 (L) >90 mL/min   GFR calc Af Amer 78 (L) >90 mL/min    Comment: (NOTE) The eGFR has been calculated using  the CKD EPI equation. This calculation has not been validated in all clinical situations. eGFR's persistently <90 mL/min signify possible Chronic Kidney Disease.    Anion gap 11 5 - 15   No results found.  Review of Systems  Constitutional: Negative.   HENT: Negative.   Eyes: Negative.   Respiratory: Negative.   Cardiovascular: Negative.   Gastrointestinal: Negative.   Genitourinary: Negative.   Musculoskeletal: Positive for joint pain.  Skin: Negative.   Neurological: Negative.   Endo/Heme/Allergies: Negative.   Psychiatric/Behavioral: Negative.     There were no vitals taken for this visit. Physical Exam  Constitutional: He is oriented to person, place, and time. He appears well-developed and well-nourished.  HENT:  Head: Normocephalic and atraumatic.  Nose: Nose normal.  Eyes: Conjunctivae and EOM are normal. Pupils are equal, round, and reactive to light.  Neck: Normal range of motion. Neck supple.  Cardiovascular: Normal rate and intact distal pulses.   Respiratory: Effort normal. No respiratory distress.  GI: Soft. He exhibits no distension. There is no tenderness.  Musculoskeletal:       Left shoulder: He exhibits decreased range of motion, tenderness, bony tenderness, pain   and decreased strength.  Neurological: He is alert and oriented to person, place, and time.  Skin: Skin is warm and dry. No erythema.  Psychiatric: He has a normal mood and affect. His behavior is normal.     Assessment/Plan Left shoulder recurrent rotator cuff tear  We discussed risks and benefits of outpatient surgery left shoulder arthroscopy debridement possible revision acromioplasty, distal clavicle excision, rotator cuff repair patient wishes to proceed.  General ansthesia IS block.    Chriss Czar 09/18/2014, 1:03 PM

## 2014-09-19 NOTE — Anesthesia Postprocedure Evaluation (Signed)
  Anesthesia Post-op Note  Patient: Theodore Moore  Procedure(s) Performed: Procedure(s): LEFT SHOULDER ARTHROSCOPY DEBRIDEMENT WITH SUBACROMIAL DECOMPRESSION  DISTAL CLAVICLE RESECTIONOPEN ROTATOR CUFF REPAIR  (Left)  Patient Location: PACU  Anesthesia Type: General   Level of Consciousness: awake, alert  and oriented  Airway and Oxygen Therapy: Patient Spontanous Breathing  Post-op Pain: mild  Post-op Assessment: Post-op Vital signs reviewed  Post-op Vital Signs: Reviewed  Last Vitals:  Filed Vitals:   09/19/14 1145  BP: 137/74  Pulse: 66  Temp:   Resp: 16    Complications: No apparent anesthesia complications

## 2014-09-19 NOTE — Interval H&P Note (Signed)
History and Physical Interval Note:  09/19/2014 8:35 AM  Theodore Moore  has presented today for surgery, with the diagnosis of LEFT DISORDER ARTICULAR CARTILAGE SHOULDER COMPLETE RUPTURE OF ROTATOR CUFF DISORDERS OF BURSAE AND TENDONS IN SHOULDER REGION   The various methods of treatment have been discussed with the patient and family. After consideration of risks, benefits and other options for treatment, the patient has consented to  Procedure(s): LEFT SHOULDER ARTHROSCOPY DEBRIDEMENT WITH SUBACROMIAL DECOMPRESSION, POSSIBLE OPEN ROTATOR CUFF REPAIR  (Left) as a surgical intervention .  The patient's history has been reviewed, patient examined, no change in status, stable for surgery.  I have reviewed the patient's chart and labs.  Questions were answered to the patient's satisfaction.     Gerene Nedd JR,W D

## 2014-09-19 NOTE — Anesthesia Procedure Notes (Addendum)
Anesthesia Regional Block:  Interscalene brachial plexus block  Pre-Anesthetic Checklist: ,, timeout performed, Correct Patient, Correct Site, Correct Laterality, Correct Procedure, Correct Position, site marked, Risks and benefits discussed,  Surgical consent,  Pre-op evaluation,  At surgeon's request and post-op pain management  Laterality: Left and Upper  Prep: chloraprep       Needles:  Injection technique: Single-shot  Needle Type: Echogenic Needle     Needle Length: 5cm 5 cm Needle Gauge: 21 and 21 G    Additional Needles:  Procedures: ultrasound guided (picture in chart) Interscalene brachial plexus block Narrative:  Start time: 09/19/2014 9:16 AM End time: 09/19/2014 9:23 AM Injection made incrementally with aspirations every 5 mL.  Performed by: Personally  Anesthesiologist: CREWS, DAVID A   Procedure Name: Intubation Date/Time: 09/19/2014 8:44 AM Performed by: Marrianne Mood Pre-anesthesia Checklist: Patient identified, Emergency Drugs available, Suction available, Patient being monitored and Timeout performed Patient Re-evaluated:Patient Re-evaluated prior to inductionOxygen Delivery Method: Circle System Utilized Preoxygenation: Pre-oxygenation with 100% oxygen Intubation Type: IV induction Ventilation: Mask ventilation without difficulty Laryngoscope Size: Miller and 3 Grade View: Grade I Tube type: Oral Tube size: 8.0 mm Number of attempts: 1 Airway Equipment and Method: stylet and oral airway Placement Confirmation: ETT inserted through vocal cords under direct vision,  positive ETCO2 and breath sounds checked- equal and bilateral Secured at: 21 cm Tube secured with: Tape Dental Injury: Teeth and Oropharynx as per pre-operative assessment

## 2014-09-19 NOTE — Brief Op Note (Signed)
09/19/2014  10:37 AM  PATIENT:  Theodore Moore  58 y.o. male  PRE-OPERATIVE DIAGNOSIS:  LEFT DISORDER ARTICULAR CARTILAGE SHOULDER COMPLETE RUPTURE OF ROTATOR CUFF DISORDERS OF BURSAE AND TENDONS IN SHOULDER REGION IMPINGEMENT  POST-OPERATIVE DIAGNOSIS:  LEFT DISORDER ARTICULAR CARTILAGE SHOULDER COMPLETE RUPTURE ROTATOR CUFF DISORDERS OF BURSAE AND TENDONS IMPINGEMENT  PROCEDURE:  Left shoulder arthroscopy debridement, open rotator cuff repair and revision acromioplasty SURGEON:  Surgeon(s) and Role:    * W D Valeta Harms., MD - Primary  PHYSICIAN ASSISTANT: Chriss Czar, PA-C  ASSISTANTS:   ANESTHESIA:   regional and general  EBL:  Total I/O In: 1500 [I.V.:1500] Out: -   BLOOD ADMINISTERED:none  DRAINS: none   LOCAL MEDICATIONS USED:  NONE  SPECIMEN:  No Specimen  DISPOSITION OF SPECIMEN:  N/A  COUNTS:  YES  TOURNIQUET:  * No tourniquets in log *  DICTATION: .Other Dictation: Dictation Number unknown  PLAN OF CARE: Discharge to home after PACU  PATIENT DISPOSITION:  PACU - hemodynamically stable.   Delay start of Pharmacological VTE agent (>24hrs) due to surgical blood loss or risk of bleeding: not applicable

## 2014-09-19 NOTE — Discharge Instructions (Signed)
Diet: As you were doing prior to hospitalization   Activity: Increase activity slowly as tolerated  No lifting or driving for 6 weeks   Shower: May shower without a dressing on post op day #3, NO SOAKING in tub   Dressing: You may change your dressing on post op day #3.  Then change the dressing daily with sterile 4"x4"s gauze dressing  Leave Steri-Strips in place until follow up  Weight Bearing: nonweight bearing left arm, remain in sling with pillow except for shower and pendulum exercises.  To prevent constipation: you may use a stool softener such as -  Colace ( over the counter) 100 mg by mouth twice a day  Drink plenty of fluids ( prune juice may be helpful) and high fiber foods  Miralax ( over the counter) for constipation as needed.   Precautions: If you experience chest pain or shortness of breath - call 911 immediately For transfer to the hospital emergency department!!  If you develop a fever greater that 101 F, purulent drainage from wound, increased redness or drainage from wound, or calf pain -- Call the office   Follow- Up Appointment: Please call for an appointment to be seen in 1 weeks  [place] - [phone]

## 2014-09-19 NOTE — Progress Notes (Signed)
Assisted Dr. Crews with left, ultrasound guided, interscalene  block. Side rails up, monitors on throughout procedure. See vital signs in flow sheet. Tolerated Procedure well. 

## 2014-09-19 NOTE — Anesthesia Preprocedure Evaluation (Signed)
Anesthesia Evaluation  Patient identified by MRN, date of birth, ID band Patient awake    Reviewed: Allergy & Precautions, H&P , NPO status , Patient's Chart, lab work & pertinent test results  Airway Mallampati: I  TM Distance: >3 FB Neck ROM: Full    Dental  (+) Teeth Intact, Partial Upper, Dental Advisory Given   Pulmonary sleep apnea (No use of CPAP at present) , former smoker,  breath sounds clear to auscultation        Cardiovascular hypertension, Pt. on medications Rhythm:Regular Rate:Normal     Neuro/Psych    GI/Hepatic   Endo/Other  Hypothyroidism   Renal/GU      Musculoskeletal   Abdominal   Peds  Hematology   Anesthesia Other Findings   Reproductive/Obstetrics                             Anesthesia Physical Anesthesia Plan  ASA: II  Anesthesia Plan: General   Post-op Pain Management:    Induction: Intravenous  Airway Management Planned: Oral ETT  Additional Equipment:   Intra-op Plan:   Post-operative Plan: Extubation in OR  Informed Consent: I have reviewed the patients History and Physical, chart, labs and discussed the procedure including the risks, benefits and alternatives for the proposed anesthesia with the patient or authorized representative who has indicated his/her understanding and acceptance.   Dental advisory given  Plan Discussed with: CRNA, Anesthesiologist and Surgeon  Anesthesia Plan Comments:         Anesthesia Quick Evaluation

## 2014-09-19 NOTE — Transfer of Care (Signed)
Immediate Anesthesia Transfer of Care Note  Patient: Theodore Moore  Procedure(s) Performed: Procedure(s): LEFT SHOULDER ARTHROSCOPY DEBRIDEMENT WITH SUBACROMIAL DECOMPRESSION  DISTAL CLAVICLE RESECTIONOPEN ROTATOR CUFF REPAIR  (Left)  Patient Location: PACU  Anesthesia Type:GA combined with regional for post-op pain  Level of Consciousness: sedated  Airway & Oxygen Therapy: Patient Spontanous Breathing and Patient connected to face mask oxygen  Post-op Assessment: Report given to PACU RN and Post -op Vital signs reviewed and stable  Post vital signs: Reviewed and stable  Complications: No apparent anesthesia complications

## 2014-09-20 ENCOUNTER — Encounter (HOSPITAL_BASED_OUTPATIENT_CLINIC_OR_DEPARTMENT_OTHER): Payer: Self-pay | Admitting: Orthopedic Surgery

## 2014-09-20 DIAGNOSIS — M75122 Complete rotator cuff tear or rupture of left shoulder, not specified as traumatic: Secondary | ICD-10-CM | POA: Diagnosis not present

## 2014-09-20 NOTE — Addendum Note (Signed)
Addendum  created 09/20/14 1150 by Tawni Millers, CRNA   Modules edited: Charges VN

## 2014-09-21 NOTE — Op Note (Signed)
NAME:  DICKSON, KOSTELNIK NO.:  [id number]  MEDICAL RECORD NO.:  [id number]  LOCATION:                                 FACILITY:  PHYSICIAN:  Lockie Pares, M.D.    DATE OF BIRTH:  [birth year]  DATE OF PROCEDURE:  09/19/2014 DATE OF DISCHARGE:  09/19/2014                              OPERATIVE REPORT   INDICATIONS:  The patient is a 58 year old who had a previous cuff repair years ago with the recurrence of the tear, thought to be amenable to outpatient surgery.  PREOPERATIVE DIAGNOSIS:  Recurrent rotator cuff tear with impingement.  POSTOPERATIVE DIAGNOSES: 1. Recurrent rotator cuff tear with impingement with noted severe     rotator cuff tear with complete tear of supraspinatus and     infraspinatus. 2. Degenerative tearing in anterior-inferior labrum. 3. Recurrent impingement.  OPERATION: 1. Open rotator cuff repair acromioplasty chronic. 2. Arthroscopic debridement intra-articularly (extensive).  SURGEON:  Lockie Pares, M.D.  ASSISTANTMarjo Bicker, PA.  DESCRIPTION OF PROCEDURE:  He was arthroscoped in a beach-chair position.  There was a really complete disruption of his old repair with remnants of sutures, lot of reactive synovitis and labral tearing noted. We performed an aggressive intra-articular debridement.  There was really severe tear within an interstitial delamination of the supraspinatus with a severe tear of the infraspinatus, certainly much worse than the MRI stated.  In any event based on the patient's young age after intra-articular debridement, I would like to open the shoulder splitting the interval between the anterior deltoid and acromion we re- resected the acromion, which had a sharp spiked edge to it.  We then visualized severe tendon damage as noted above.  After extensive mobilization, I was able to identify and bring the portion of the infraspinatus back into side-to-side repair with a remnant of the supraspinatus and then we  dissected anteriorly, discovered a remnant of the supraspinatus that was delaminated somewhat anteriorly.  I placed one central anchor on the remnant of the infraspinatus to the supraspinatus and then we really did more of a front to back repair of the remnant of the supraspinatus to the infraspinatus with a total of 3 __________ for a total of 6 sutures with significant amount of interleaving of these leaflets with interrupted suture as well.  As soon as the tissue quality was severely compromised particularly in the infraspinatus level, but given the situation, we ended up, felt like we had a reasonably good repair given the tissue quality we had to work with.  The wound was copiously irrigated several times during the case. The __________ was closed with #1 Tycron.  Subcu tissues 2-0 Vicryl. Skin with Monocryl.  Lightly compressive sterile dressing and UltraSling applied.  Taken to the recovery in stable condition.  Follow up plan for the patient to be seen sometime next in the office.  Given prescriptions for Percocet and Robaxin.     Lockie Pares, M.D.     WDC/MEDQ  D:  09/19/2014  T:  09/19/2014  Job:  [id number] [phone]

## 2014-09-25 ENCOUNTER — Ambulatory Visit (INDEPENDENT_AMBULATORY_CARE_PROVIDER_SITE_OTHER): Payer: 59 | Admitting: Physical Therapy

## 2014-09-25 DIAGNOSIS — S46012A Strain of muscle(s) and tendon(s) of the rotator cuff of left shoulder, initial encounter: Secondary | ICD-10-CM

## 2014-09-25 DIAGNOSIS — M25579 Pain in unspecified ankle and joints of unspecified foot: Secondary | ICD-10-CM

## 2014-09-25 DIAGNOSIS — M24112 Other articular cartilage disorders, left shoulder: Secondary | ICD-10-CM

## 2014-09-25 DIAGNOSIS — M25619 Stiffness of unspecified shoulder, not elsewhere classified: Secondary | ICD-10-CM

## 2014-09-25 DIAGNOSIS — M6281 Muscle weakness (generalized): Secondary | ICD-10-CM

## 2014-09-28 ENCOUNTER — Encounter: Payer: 59 | Admitting: Physical Therapy

## 2014-10-01 ENCOUNTER — Encounter (INDEPENDENT_AMBULATORY_CARE_PROVIDER_SITE_OTHER): Payer: 59 | Admitting: Physical Therapy

## 2014-10-01 DIAGNOSIS — M25519 Pain in unspecified shoulder: Secondary | ICD-10-CM

## 2014-10-01 DIAGNOSIS — M6281 Muscle weakness (generalized): Secondary | ICD-10-CM

## 2014-10-01 DIAGNOSIS — S46012A Strain of muscle(s) and tendon(s) of the rotator cuff of left shoulder, initial encounter: Secondary | ICD-10-CM

## 2014-10-01 DIAGNOSIS — M24112 Other articular cartilage disorders, left shoulder: Secondary | ICD-10-CM

## 2014-10-01 DIAGNOSIS — M25619 Stiffness of unspecified shoulder, not elsewhere classified: Secondary | ICD-10-CM

## 2014-10-08 ENCOUNTER — Encounter (INDEPENDENT_AMBULATORY_CARE_PROVIDER_SITE_OTHER): Payer: 59 | Admitting: Physical Therapy

## 2014-10-08 DIAGNOSIS — M25619 Stiffness of unspecified shoulder, not elsewhere classified: Secondary | ICD-10-CM

## 2014-10-08 DIAGNOSIS — S46012A Strain of muscle(s) and tendon(s) of the rotator cuff of left shoulder, initial encounter: Secondary | ICD-10-CM

## 2014-10-08 DIAGNOSIS — M25519 Pain in unspecified shoulder: Secondary | ICD-10-CM

## 2014-10-08 DIAGNOSIS — M6281 Muscle weakness (generalized): Secondary | ICD-10-CM

## 2014-10-08 DIAGNOSIS — M24112 Other articular cartilage disorders, left shoulder: Secondary | ICD-10-CM

## 2014-10-11 ENCOUNTER — Encounter (INDEPENDENT_AMBULATORY_CARE_PROVIDER_SITE_OTHER): Payer: 59 | Admitting: Physical Therapy

## 2014-10-11 DIAGNOSIS — M24112 Other articular cartilage disorders, left shoulder: Secondary | ICD-10-CM

## 2014-10-11 DIAGNOSIS — M6281 Muscle weakness (generalized): Secondary | ICD-10-CM

## 2014-10-11 DIAGNOSIS — M25619 Stiffness of unspecified shoulder, not elsewhere classified: Secondary | ICD-10-CM

## 2014-10-11 DIAGNOSIS — S46012A Strain of muscle(s) and tendon(s) of the rotator cuff of left shoulder, initial encounter: Secondary | ICD-10-CM

## 2014-10-11 DIAGNOSIS — M25519 Pain in unspecified shoulder: Secondary | ICD-10-CM

## 2014-10-15 ENCOUNTER — Encounter (INDEPENDENT_AMBULATORY_CARE_PROVIDER_SITE_OTHER): Payer: 59 | Admitting: Physical Therapy

## 2014-10-15 DIAGNOSIS — M24112 Other articular cartilage disorders, left shoulder: Secondary | ICD-10-CM

## 2014-10-15 DIAGNOSIS — M25619 Stiffness of unspecified shoulder, not elsewhere classified: Secondary | ICD-10-CM

## 2014-10-15 DIAGNOSIS — M25519 Pain in unspecified shoulder: Secondary | ICD-10-CM

## 2014-10-15 DIAGNOSIS — S46012A Strain of muscle(s) and tendon(s) of the rotator cuff of left shoulder, initial encounter: Secondary | ICD-10-CM

## 2014-10-15 DIAGNOSIS — M6281 Muscle weakness (generalized): Secondary | ICD-10-CM

## 2014-10-18 ENCOUNTER — Encounter (INDEPENDENT_AMBULATORY_CARE_PROVIDER_SITE_OTHER): Payer: 59 | Admitting: Physical Therapy

## 2014-10-18 DIAGNOSIS — M6281 Muscle weakness (generalized): Secondary | ICD-10-CM

## 2014-10-18 DIAGNOSIS — M25619 Stiffness of unspecified shoulder, not elsewhere classified: Secondary | ICD-10-CM

## 2014-10-18 DIAGNOSIS — M25519 Pain in unspecified shoulder: Secondary | ICD-10-CM

## 2014-10-18 DIAGNOSIS — S46012A Strain of muscle(s) and tendon(s) of the rotator cuff of left shoulder, initial encounter: Secondary | ICD-10-CM

## 2014-10-18 DIAGNOSIS — M24112 Other articular cartilage disorders, left shoulder: Secondary | ICD-10-CM

## 2014-10-22 ENCOUNTER — Encounter (INDEPENDENT_AMBULATORY_CARE_PROVIDER_SITE_OTHER): Payer: 59 | Admitting: Physical Therapy

## 2014-10-22 DIAGNOSIS — S46012A Strain of muscle(s) and tendon(s) of the rotator cuff of left shoulder, initial encounter: Secondary | ICD-10-CM

## 2014-10-22 DIAGNOSIS — M25649 Stiffness of unspecified hand, not elsewhere classified: Secondary | ICD-10-CM

## 2014-10-22 DIAGNOSIS — M24112 Other articular cartilage disorders, left shoulder: Secondary | ICD-10-CM

## 2014-10-22 DIAGNOSIS — M25519 Pain in unspecified shoulder: Secondary | ICD-10-CM

## 2014-10-22 DIAGNOSIS — M6281 Muscle weakness (generalized): Secondary | ICD-10-CM

## 2014-10-25 ENCOUNTER — Encounter (INDEPENDENT_AMBULATORY_CARE_PROVIDER_SITE_OTHER): Payer: 59 | Admitting: Physical Therapy

## 2014-10-25 DIAGNOSIS — M24112 Other articular cartilage disorders, left shoulder: Secondary | ICD-10-CM

## 2014-10-25 DIAGNOSIS — M25519 Pain in unspecified shoulder: Secondary | ICD-10-CM

## 2014-10-25 DIAGNOSIS — S46012A Strain of muscle(s) and tendon(s) of the rotator cuff of left shoulder, initial encounter: Secondary | ICD-10-CM

## 2014-10-25 DIAGNOSIS — M25619 Stiffness of unspecified shoulder, not elsewhere classified: Secondary | ICD-10-CM

## 2014-10-25 DIAGNOSIS — M6281 Muscle weakness (generalized): Secondary | ICD-10-CM

## 2014-10-30 ENCOUNTER — Encounter (INDEPENDENT_AMBULATORY_CARE_PROVIDER_SITE_OTHER): Payer: 59 | Admitting: Physical Therapy

## 2014-10-30 DIAGNOSIS — M24112 Other articular cartilage disorders, left shoulder: Secondary | ICD-10-CM

## 2014-10-30 DIAGNOSIS — S46012A Strain of muscle(s) and tendon(s) of the rotator cuff of left shoulder, initial encounter: Secondary | ICD-10-CM

## 2014-10-30 DIAGNOSIS — M25519 Pain in unspecified shoulder: Secondary | ICD-10-CM

## 2014-10-30 DIAGNOSIS — M6281 Muscle weakness (generalized): Secondary | ICD-10-CM

## 2014-10-30 DIAGNOSIS — M25619 Stiffness of unspecified shoulder, not elsewhere classified: Secondary | ICD-10-CM

## 2014-11-01 ENCOUNTER — Encounter (INDEPENDENT_AMBULATORY_CARE_PROVIDER_SITE_OTHER): Payer: 59 | Admitting: Physical Therapy

## 2014-11-01 DIAGNOSIS — M24112 Other articular cartilage disorders, left shoulder: Secondary | ICD-10-CM

## 2014-11-01 DIAGNOSIS — M25519 Pain in unspecified shoulder: Secondary | ICD-10-CM

## 2014-11-01 DIAGNOSIS — M25619 Stiffness of unspecified shoulder, not elsewhere classified: Secondary | ICD-10-CM

## 2014-11-01 DIAGNOSIS — M6281 Muscle weakness (generalized): Secondary | ICD-10-CM

## 2014-11-01 DIAGNOSIS — S46012A Strain of muscle(s) and tendon(s) of the rotator cuff of left shoulder, initial encounter: Secondary | ICD-10-CM

## 2014-11-05 ENCOUNTER — Encounter (INDEPENDENT_AMBULATORY_CARE_PROVIDER_SITE_OTHER): Payer: 59 | Admitting: Physical Therapy

## 2014-11-05 DIAGNOSIS — M25619 Stiffness of unspecified shoulder, not elsewhere classified: Secondary | ICD-10-CM

## 2014-11-05 DIAGNOSIS — S46012A Strain of muscle(s) and tendon(s) of the rotator cuff of left shoulder, initial encounter: Secondary | ICD-10-CM

## 2014-11-05 DIAGNOSIS — M25519 Pain in unspecified shoulder: Secondary | ICD-10-CM

## 2014-11-05 DIAGNOSIS — M6281 Muscle weakness (generalized): Secondary | ICD-10-CM

## 2014-11-05 DIAGNOSIS — M24112 Other articular cartilage disorders, left shoulder: Secondary | ICD-10-CM

## 2014-11-08 ENCOUNTER — Encounter (INDEPENDENT_AMBULATORY_CARE_PROVIDER_SITE_OTHER): Payer: 59 | Admitting: Physical Therapy

## 2014-11-08 DIAGNOSIS — M6281 Muscle weakness (generalized): Secondary | ICD-10-CM

## 2014-11-08 DIAGNOSIS — M25519 Pain in unspecified shoulder: Secondary | ICD-10-CM

## 2014-11-08 DIAGNOSIS — M24112 Other articular cartilage disorders, left shoulder: Secondary | ICD-10-CM

## 2014-11-08 DIAGNOSIS — S46012A Strain of muscle(s) and tendon(s) of the rotator cuff of left shoulder, initial encounter: Secondary | ICD-10-CM

## 2014-11-08 DIAGNOSIS — M25619 Stiffness of unspecified shoulder, not elsewhere classified: Secondary | ICD-10-CM

## 2014-11-12 ENCOUNTER — Encounter (INDEPENDENT_AMBULATORY_CARE_PROVIDER_SITE_OTHER): Payer: 59 | Admitting: Physical Therapy

## 2014-11-12 DIAGNOSIS — M25619 Stiffness of unspecified shoulder, not elsewhere classified: Secondary | ICD-10-CM

## 2014-11-12 DIAGNOSIS — M25519 Pain in unspecified shoulder: Secondary | ICD-10-CM

## 2014-11-12 DIAGNOSIS — M6281 Muscle weakness (generalized): Secondary | ICD-10-CM

## 2014-11-12 DIAGNOSIS — S46012A Strain of muscle(s) and tendon(s) of the rotator cuff of left shoulder, initial encounter: Secondary | ICD-10-CM

## 2014-11-12 DIAGNOSIS — M24112 Other articular cartilage disorders, left shoulder: Secondary | ICD-10-CM

## 2014-11-15 ENCOUNTER — Encounter (INDEPENDENT_AMBULATORY_CARE_PROVIDER_SITE_OTHER): Payer: 59 | Admitting: Physical Therapy

## 2014-11-15 DIAGNOSIS — M25519 Pain in unspecified shoulder: Secondary | ICD-10-CM

## 2014-11-15 DIAGNOSIS — M25619 Stiffness of unspecified shoulder, not elsewhere classified: Secondary | ICD-10-CM

## 2014-11-15 DIAGNOSIS — S46012A Strain of muscle(s) and tendon(s) of the rotator cuff of left shoulder, initial encounter: Secondary | ICD-10-CM

## 2014-11-15 DIAGNOSIS — M6281 Muscle weakness (generalized): Secondary | ICD-10-CM

## 2014-11-15 DIAGNOSIS — M24112 Other articular cartilage disorders, left shoulder: Secondary | ICD-10-CM

## 2014-11-19 ENCOUNTER — Encounter (INDEPENDENT_AMBULATORY_CARE_PROVIDER_SITE_OTHER): Payer: 59 | Admitting: Physical Therapy

## 2014-11-19 DIAGNOSIS — S46012A Strain of muscle(s) and tendon(s) of the rotator cuff of left shoulder, initial encounter: Secondary | ICD-10-CM

## 2014-11-19 DIAGNOSIS — M24112 Other articular cartilage disorders, left shoulder: Secondary | ICD-10-CM

## 2014-11-19 DIAGNOSIS — M6281 Muscle weakness (generalized): Secondary | ICD-10-CM

## 2014-11-19 DIAGNOSIS — M25519 Pain in unspecified shoulder: Secondary | ICD-10-CM

## 2014-11-19 DIAGNOSIS — M25619 Stiffness of unspecified shoulder, not elsewhere classified: Secondary | ICD-10-CM

## 2014-11-22 ENCOUNTER — Encounter (INDEPENDENT_AMBULATORY_CARE_PROVIDER_SITE_OTHER): Payer: 59 | Admitting: Physical Therapy

## 2014-11-22 DIAGNOSIS — M6281 Muscle weakness (generalized): Secondary | ICD-10-CM

## 2014-11-22 DIAGNOSIS — M25519 Pain in unspecified shoulder: Secondary | ICD-10-CM

## 2014-11-22 DIAGNOSIS — M25619 Stiffness of unspecified shoulder, not elsewhere classified: Secondary | ICD-10-CM

## 2014-11-22 DIAGNOSIS — S46012A Strain of muscle(s) and tendon(s) of the rotator cuff of left shoulder, initial encounter: Secondary | ICD-10-CM

## 2014-11-22 DIAGNOSIS — M24112 Other articular cartilage disorders, left shoulder: Secondary | ICD-10-CM

## 2014-12-06 ENCOUNTER — Encounter: Payer: 59 | Admitting: Physical Therapy

## 2016-03-11 ENCOUNTER — Emergency Department
Admission: EM | Admit: 2016-03-11 | Discharge: 2016-03-11 | Discharge status: Absent without leave | Payer: 59 | Source: Home / Self Care

## 2020-07-12 DIAGNOSIS — I2510 Atherosclerotic heart disease of native coronary artery without angina pectoris: Secondary | ICD-10-CM

## 2020-07-12 HISTORY — DX: Atherosclerotic heart disease of native coronary artery without angina pectoris: I25.10

## 2021-04-18 ENCOUNTER — Ambulatory Visit
Admission: RE | Admit: 2021-04-18 | Discharge: 2021-04-18 | Disposition: A | Discharge status: Home / Self Care | Payer: 59 | Source: Ambulatory Visit | Attending: Family Medicine | Admitting: Family Medicine

## 2021-04-18 ENCOUNTER — Other Ambulatory Visit: Payer: Self-pay | Admitting: Family Medicine

## 2021-04-18 DIAGNOSIS — M5431 Sciatica, right side: Secondary | ICD-10-CM

## 2021-10-31 ENCOUNTER — Ambulatory Visit: Payer: 59 | Admitting: Orthopedic Surgery

## 2021-10-31 DIAGNOSIS — R202 Paresthesia of skin: Secondary | ICD-10-CM | POA: Diagnosis not present

## 2021-10-31 DIAGNOSIS — R200 Anesthesia of skin: Secondary | ICD-10-CM | POA: Diagnosis not present

## 2021-11-11 DIAGNOSIS — K4020 Bilateral inguinal hernia, without obstruction or gangrene, not specified as recurrent: Secondary | ICD-10-CM | POA: Diagnosis not present

## 2021-11-12 DIAGNOSIS — G5613 Other lesions of median nerve, bilateral upper limbs: Secondary | ICD-10-CM | POA: Diagnosis not present

## 2021-11-21 ENCOUNTER — Other Ambulatory Visit: Payer: Self-pay | Admitting: Orthopedic Surgery

## 2021-11-21 DIAGNOSIS — G5603 Carpal tunnel syndrome, bilateral upper limbs: Secondary | ICD-10-CM | POA: Diagnosis not present

## 2022-01-02 ENCOUNTER — Encounter (HOSPITAL_BASED_OUTPATIENT_CLINIC_OR_DEPARTMENT_OTHER): Payer: Self-pay | Admitting: General Surgery

## 2022-01-02 ENCOUNTER — Other Ambulatory Visit: Payer: Self-pay

## 2022-01-05 ENCOUNTER — Encounter (HOSPITAL_BASED_OUTPATIENT_CLINIC_OR_DEPARTMENT_OTHER)
Admission: RE | Admit: 2022-01-05 | Discharge: 2022-01-05 | Disposition: A | Discharge status: Home / Self Care | Payer: PPO | Source: Ambulatory Visit | Attending: General Surgery | Admitting: General Surgery

## 2022-01-05 DIAGNOSIS — Z01818 Encounter for other preprocedural examination: Secondary | ICD-10-CM | POA: Insufficient documentation

## 2022-01-05 LAB — BASIC METABOLIC PANEL
Anion gap: 5 (ref 5–15)
BUN: 25 mg/dL — ABNORMAL HIGH (ref 8–23)
CO2: 25 mmol/L (ref 22–32)
Calcium: 8.9 mg/dL (ref 8.9–10.3)
Chloride: 110 mmol/L (ref 98–111)
Creatinine, Ser: 1.27 mg/dL — ABNORMAL HIGH (ref 0.61–1.24)
GFR, Estimated: >60 mL/min (ref >60)
Glucose, Bld: 99 mg/dL (ref 70–99)
Potassium: 4.8 mmol/L (ref 3.5–5.1)
Sodium: 140 mmol/L (ref 135–145)

## 2022-01-05 NOTE — Progress Notes (Signed)

## 2022-01-06 ENCOUNTER — Ambulatory Visit: Payer: Self-pay | Admitting: General Surgery

## 2022-01-09 ENCOUNTER — Ambulatory Visit (HOSPITAL_BASED_OUTPATIENT_CLINIC_OR_DEPARTMENT_OTHER): Payer: PPO | Admitting: Certified Registered"

## 2022-01-09 ENCOUNTER — Ambulatory Visit (HOSPITAL_BASED_OUTPATIENT_CLINIC_OR_DEPARTMENT_OTHER)
Admission: RE | Admit: 2022-01-09 | Discharge: 2022-01-09 | Disposition: A | Discharge status: Home / Self Care | Payer: PPO | Source: Ambulatory Visit | Attending: General Surgery | Admitting: General Surgery

## 2022-01-09 ENCOUNTER — Other Ambulatory Visit: Payer: Self-pay

## 2022-01-09 ENCOUNTER — Encounter (HOSPITAL_BASED_OUTPATIENT_CLINIC_OR_DEPARTMENT_OTHER): Payer: Self-pay | Admitting: General Surgery

## 2022-01-09 ENCOUNTER — Encounter (HOSPITAL_BASED_OUTPATIENT_CLINIC_OR_DEPARTMENT_OTHER)
Admission: RE | Disposition: A | Discharge status: Home / Self Care | Payer: Self-pay | Source: Ambulatory Visit | Attending: General Surgery

## 2022-01-09 ENCOUNTER — Other Ambulatory Visit (HOSPITAL_COMMUNITY): Payer: Self-pay

## 2022-01-09 DIAGNOSIS — G8918 Other acute postprocedural pain: Secondary | ICD-10-CM | POA: Diagnosis not present

## 2022-01-09 DIAGNOSIS — Z955 Presence of coronary angioplasty implant and graft: Secondary | ICD-10-CM | POA: Insufficient documentation

## 2022-01-09 DIAGNOSIS — G5601 Carpal tunnel syndrome, right upper limb: Secondary | ICD-10-CM

## 2022-01-09 DIAGNOSIS — K4020 Bilateral inguinal hernia, without obstruction or gangrene, not specified as recurrent: Secondary | ICD-10-CM | POA: Insufficient documentation

## 2022-01-09 DIAGNOSIS — I2510 Atherosclerotic heart disease of native coronary artery without angina pectoris: Secondary | ICD-10-CM

## 2022-01-09 DIAGNOSIS — M1990 Unspecified osteoarthritis, unspecified site: Secondary | ICD-10-CM | POA: Insufficient documentation

## 2022-01-09 DIAGNOSIS — I10 Essential (primary) hypertension: Secondary | ICD-10-CM

## 2022-01-09 DIAGNOSIS — E039 Hypothyroidism, unspecified: Secondary | ICD-10-CM | POA: Insufficient documentation

## 2022-01-09 DIAGNOSIS — G4730 Sleep apnea, unspecified: Secondary | ICD-10-CM | POA: Insufficient documentation

## 2022-01-09 DIAGNOSIS — D176 Benign lipomatous neoplasm of spermatic cord: Secondary | ICD-10-CM | POA: Diagnosis not present

## 2022-01-09 HISTORY — PX: INGUINAL HERNIA REPAIR: SHX194

## 2022-01-09 HISTORY — PX: CARPAL TUNNEL RELEASE: SHX101

## 2022-01-09 SURGERY — CARPAL TUNNEL RELEASE
Anesthesia: Regional | Site: Hand | Laterality: Right

## 2022-01-09 MED ORDER — ONDANSETRON HCL 4 MG/2ML IJ SOLN
INTRAMUSCULAR | Status: AC
  Filled 2022-01-09: qty 2

## 2022-01-09 MED ORDER — FENTANYL CITRATE (PF) 100 MCG/2ML IJ SOLN: 25.0000 ug | INTRAMUSCULAR | Status: DC | PRN

## 2022-01-09 MED ORDER — BUPIVACAINE HCL (PF) 0.25 % IJ SOLN
INTRAMUSCULAR | Status: AC
  Filled 2022-01-09: qty 30

## 2022-01-09 MED ORDER — LIDOCAINE 2% (20 MG/ML) 5 ML SYRINGE
INTRAMUSCULAR | Status: AC
  Filled 2022-01-09: qty 5

## 2022-01-09 MED ORDER — GABAPENTIN 300 MG PO CAPS
300.0000 mg | ORAL_CAPSULE | ORAL | Status: AC
  Administered 2022-01-09: 300 mg via ORAL

## 2022-01-09 MED ORDER — AMISULPRIDE (ANTIEMETIC) 5 MG/2ML IV SOLN: 10.0000 mg | Freq: Once | INTRAVENOUS | Status: DC | PRN

## 2022-01-09 MED ORDER — CHLORHEXIDINE GLUCONATE CLOTH 2 % EX PADS: 6.0000 | MEDICATED_PAD | Freq: Once | CUTANEOUS | Status: DC

## 2022-01-09 MED ORDER — ROCURONIUM BROMIDE 100 MG/10ML IV SOLN
INTRAVENOUS | Status: DC | PRN
  Administered 2022-01-09: 40 mg via INTRAVENOUS

## 2022-01-09 MED ORDER — FENTANYL CITRATE (PF) 100 MCG/2ML IJ SOLN
INTRAMUSCULAR | Status: AC
  Filled 2022-01-09: qty 2

## 2022-01-09 MED ORDER — OXYCODONE HCL 5 MG/5ML PO SOLN: 5.0000 mg | Freq: Once | ORAL | Status: DC | PRN

## 2022-01-09 MED ORDER — MIDAZOLAM HCL 2 MG/2ML IJ SOLN
INTRAMUSCULAR | Status: AC
  Filled 2022-01-09: qty 2

## 2022-01-09 MED ORDER — DEXAMETHASONE SODIUM PHOSPHATE 10 MG/ML IJ SOLN
INTRAMUSCULAR | Status: DC | PRN
  Administered 2022-01-09: 4 mg via INTRAVENOUS

## 2022-01-09 MED ORDER — PROPOFOL 10 MG/ML IV BOLUS
INTRAVENOUS | Status: AC
  Filled 2022-01-09: qty 20

## 2022-01-09 MED ORDER — FENTANYL CITRATE (PF) 100 MCG/2ML IJ SOLN
INTRAMUSCULAR | Status: DC | PRN
  Administered 2022-01-09: 100 ug via INTRAVENOUS
  Administered 2022-01-09: 50 ug via INTRAVENOUS

## 2022-01-09 MED ORDER — SUGAMMADEX SODIUM 200 MG/2ML IV SOLN
INTRAVENOUS | Status: DC | PRN
  Administered 2022-01-09: 200 mg via INTRAVENOUS

## 2022-01-09 MED ORDER — BUPIVACAINE HCL (PF) 0.25 % IJ SOLN
INTRAMUSCULAR | Status: DC | PRN
  Administered 2022-01-09 (×2): 10 mL

## 2022-01-09 MED ORDER — EPHEDRINE 5 MG/ML INJ
INTRAVENOUS | Status: AC
  Filled 2022-01-09: qty 5

## 2022-01-09 MED ORDER — LACTATED RINGERS IV SOLN: INTRAVENOUS | Status: DC

## 2022-01-09 MED ORDER — ROCURONIUM BROMIDE 10 MG/ML (PF) SYRINGE
PREFILLED_SYRINGE | INTRAVENOUS | Status: AC
  Filled 2022-01-09: qty 10

## 2022-01-09 MED ORDER — LIDOCAINE 2% (20 MG/ML) 5 ML SYRINGE
INTRAMUSCULAR | Status: DC | PRN
  Administered 2022-01-09: 40 mg via INTRAVENOUS

## 2022-01-09 MED ORDER — GABAPENTIN 300 MG PO CAPS
ORAL_CAPSULE | ORAL | Status: AC
  Filled 2022-01-09: qty 1

## 2022-01-09 MED ORDER — ONDANSETRON HCL 4 MG/2ML IJ SOLN
INTRAMUSCULAR | Status: DC | PRN
  Administered 2022-01-09: 4 mg via INTRAVENOUS

## 2022-01-09 MED ORDER — ACETAMINOPHEN 500 MG PO TABS
1000.0000 mg | ORAL_TABLET | ORAL | Status: AC
  Administered 2022-01-09: 1000 mg via ORAL

## 2022-01-09 MED ORDER — BUPIVACAINE HCL (PF) 0.25 % IJ SOLN
INTRAMUSCULAR | Status: DC | PRN
  Administered 2022-01-09 (×2): 30 mL via PERINEURAL

## 2022-01-09 MED ORDER — CELECOXIB 200 MG PO CAPS
ORAL_CAPSULE | ORAL | Status: AC
  Filled 2022-01-09: qty 1

## 2022-01-09 MED ORDER — MIDAZOLAM HCL 2 MG/2ML IJ SOLN
2.0000 mg | Freq: Once | INTRAMUSCULAR | Status: AC
Start: 2022-01-09 — End: 2022-01-09
  Administered 2022-01-09: 2 mg via INTRAVENOUS

## 2022-01-09 MED ORDER — OXYCODONE HCL 5 MG PO TABS: 5.0000 mg | ORAL_TABLET | Freq: Once | ORAL | Status: DC | PRN

## 2022-01-09 MED ORDER — CEFAZOLIN SODIUM-DEXTROSE 2-4 GM/100ML-% IV SOLN
2.0000 g | INTRAVENOUS | Status: AC
  Administered 2022-01-09: 2 g via INTRAVENOUS

## 2022-01-09 MED ORDER — FENTANYL CITRATE (PF) 100 MCG/2ML IJ SOLN
100.0000 ug | Freq: Once | INTRAMUSCULAR | Status: AC
  Administered 2022-01-09: 100 ug via INTRAVENOUS

## 2022-01-09 MED ORDER — 0.9 % SODIUM CHLORIDE (POUR BTL) OPTIME
TOPICAL | Status: DC | PRN
  Administered 2022-01-09: 75 mL

## 2022-01-09 MED ORDER — CEFAZOLIN SODIUM-DEXTROSE 2-4 GM/100ML-% IV SOLN
INTRAVENOUS | Status: AC
  Filled 2022-01-09: qty 100

## 2022-01-09 MED ORDER — PROPOFOL 10 MG/ML IV BOLUS
INTRAVENOUS | Status: DC | PRN
  Administered 2022-01-09: 100 mg via INTRAVENOUS

## 2022-01-09 MED ORDER — EPHEDRINE SULFATE (PRESSORS) 50 MG/ML IJ SOLN
INTRAMUSCULAR | Status: DC | PRN
  Administered 2022-01-09: 5 mg via INTRAVENOUS
  Administered 2022-01-09 (×2): 10 mg via INTRAVENOUS
  Administered 2022-01-09 (×4): 5 mg via INTRAVENOUS

## 2022-01-09 MED ORDER — TRAMADOL HCL 50 MG PO TABS
50.0000 mg | ORAL_TABLET | Freq: Four times a day (QID) | ORAL | 0 refills | Status: AC | PRN
  Filled 2022-01-09: qty 20, 5d supply, fill #0

## 2022-01-09 MED ORDER — CELECOXIB 200 MG PO CAPS
200.0000 mg | ORAL_CAPSULE | ORAL | Status: AC
  Administered 2022-01-09: 200 mg via ORAL

## 2022-01-09 MED ORDER — ACETAMINOPHEN 500 MG PO TABS
ORAL_TABLET | ORAL | Status: AC
  Filled 2022-01-09: qty 2

## 2022-01-09 MED ORDER — CEFAZOLIN SODIUM-DEXTROSE 2-4 GM/100ML-% IV SOLN
2.0000 g | INTRAVENOUS | Status: DC
Start: 2022-01-09 — End: 2022-01-09

## 2022-01-09 MED ORDER — DEXAMETHASONE SODIUM PHOSPHATE 10 MG/ML IJ SOLN
INTRAMUSCULAR | Status: AC
  Filled 2022-01-09: qty 1

## 2022-01-09 SURGICAL SUPPLY — 68 items
ADH SKN CLS APL DERMABOND .7 (GAUZE/BANDAGES/DRESSINGS) ×4
APL PRP STRL LF DISP 70% ISPRP (MISCELLANEOUS) ×8
APL SKNCLS STERI-STRIP NONHPOA (GAUZE/BANDAGES/DRESSINGS)
BENZOIN TINCTURE PRP APPL 2/3 (GAUZE/BANDAGES/DRESSINGS) IMPLANT
BLADE CLIPPER SURG (BLADE) ×3 IMPLANT
BLADE HEX COATED 2.75 (ELECTRODE) ×3 IMPLANT
BLADE SURG 15 STRL LF DISP TIS (BLADE) ×15 IMPLANT
BLADE SURG 15 STRL SS (BLADE) ×15
BNDG CMPR 9X4 STRL LF SNTH (GAUZE/BANDAGES/DRESSINGS) ×4
BNDG ELASTIC 3X5.8 VLCR STR LF (GAUZE/BANDAGES/DRESSINGS) ×6 IMPLANT
BNDG ESMARK 4X9 LF (GAUZE/BANDAGES/DRESSINGS) ×3 IMPLANT
BNDG GAUZE ELAST 4 BULKY (GAUZE/BANDAGES/DRESSINGS) ×6 IMPLANT
CHLORAPREP W/TINT 26 (MISCELLANEOUS) ×12 IMPLANT
CORD BIPOLAR FORCEPS 12FT (ELECTRODE) ×6 IMPLANT
COVER BACK TABLE 60X90IN (DRAPES) ×9 IMPLANT
COVER MAYO STAND STRL (DRAPES) ×12 IMPLANT
CUFF TOURN SGL QUICK 18X4 (TOURNIQUET CUFF) ×3 IMPLANT
DERMABOND ADVANCED (GAUZE/BANDAGES/DRESSINGS) ×1
DERMABOND ADVANCED .7 DNX12 (GAUZE/BANDAGES/DRESSINGS) ×5 IMPLANT
DRAIN PENROSE .5X12 LATEX STL (DRAIN) ×6 IMPLANT
DRAPE EXTREMITY T 121X128X90 (DISPOSABLE) ×6 IMPLANT
DRAPE LAPAROTOMY TRNSV 102X78 (DRAPES) ×6 IMPLANT
DRAPE SURG 17X23 STRL (DRAPES) ×3 IMPLANT
DRAPE UTILITY XL STRL (DRAPES) ×6 IMPLANT
DRSG PAD ABDOMINAL 8X10 ST (GAUZE/BANDAGES/DRESSINGS) ×6 IMPLANT
ELECT COATED BLADE 2.86 ST (ELECTRODE) ×3 IMPLANT
ELECT REM PT RETURN 9FT ADLT (ELECTROSURGICAL) ×5
ELECTRODE REM PT RTRN 9FT ADLT (ELECTROSURGICAL) ×5 IMPLANT
GAUZE SPONGE 4X4 12PLY STRL (GAUZE/BANDAGES/DRESSINGS) ×6 IMPLANT
GAUZE XEROFORM 1X8 LF (GAUZE/BANDAGES/DRESSINGS) ×6 IMPLANT
GLOVE SRG 8 PF TXTR STRL LF DI (GLOVE) ×5 IMPLANT
GLOVE SURG ENC MOIS LTX SZ7.5 (GLOVE) ×12 IMPLANT
GLOVE SURG UNDER POLY LF SZ7 (GLOVE) ×12 IMPLANT
GLOVE SURG UNDER POLY LF SZ8 (GLOVE) ×5
GOWN STRL REUS W/ TWL LRG LVL3 (GOWN DISPOSABLE) ×17 IMPLANT
GOWN STRL REUS W/TWL LRG LVL3 (GOWN DISPOSABLE) ×20
GOWN STRL REUS W/TWL XL LVL3 (GOWN DISPOSABLE) ×6 IMPLANT
MESH ULTRAPRO 3X6 7.6X15CM (Mesh General) ×6 IMPLANT
NDL HYPO 25X1 1.5 SAFETY (NEEDLE) ×6 IMPLANT
NEEDLE HYPO 22GX1.5 SAFETY (NEEDLE) IMPLANT
NEEDLE HYPO 25X1 1.5 SAFETY (NEEDLE) ×10 IMPLANT
NS IRRIG 1000ML POUR BTL (IV SOLUTION) ×9 IMPLANT
PACK BASIN DAY SURGERY FS (CUSTOM PROCEDURE TRAY) ×9 IMPLANT
PADDING CAST ABS 4INX4YD NS (CAST SUPPLIES)
PADDING CAST ABS COTTON 4X4 ST (CAST SUPPLIES) ×3 IMPLANT
PENCIL SMOKE EVACUATOR (MISCELLANEOUS) ×6 IMPLANT
SLEEVE SCD COMPRESS KNEE MED (STOCKING) ×6 IMPLANT
SPIKE FLUID TRANSFER (MISCELLANEOUS) IMPLANT
SPONGE T-LAP 18X18 ~~LOC~~+RFID (SPONGE) ×6 IMPLANT
STOCKINETTE 4X48 STRL (DRAPES) ×6 IMPLANT
STRIP CLOSURE SKIN 1/2X4 (GAUZE/BANDAGES/DRESSINGS) IMPLANT
SUT ETHILON 4 0 PS 2 18 (SUTURE) ×6 IMPLANT
SUT MON AB 4-0 PC3 18 (SUTURE) ×9 IMPLANT
SUT PROLENE 2 0 SH DA (SUTURE) ×24 IMPLANT
SUT SILK 2 0 SH (SUTURE) ×6 IMPLANT
SUT SILK 3 0 SH 30 (SUTURE) IMPLANT
SUT SILK 3 0 TIES 17X18 (SUTURE) ×5
SUT SILK 3-0 18XBRD TIE BLK (SUTURE) ×5 IMPLANT
SUT VIC AB 2-0 SH 27 (SUTURE) ×10
SUT VIC AB 2-0 SH 27XBRD (SUTURE) ×7 IMPLANT
SUT VIC AB 3-0 SH 27 (SUTURE) ×10
SUT VIC AB 3-0 SH 27X BRD (SUTURE) ×7 IMPLANT
SUT VICRYL 0 CT-2 (SUTURE) ×6 IMPLANT
SUT VICRYL 0 SH 27 (SUTURE) IMPLANT
SYR BULB EAR ULCER 3OZ GRN STR (SYRINGE) ×6 IMPLANT
SYR CONTROL 10ML LL (SYRINGE) ×12 IMPLANT
TOWEL GREEN STERILE FF (TOWEL DISPOSABLE) ×15 IMPLANT
UNDERPAD 30X36 HEAVY ABSORB (UNDERPADS AND DIAPERS) ×6 IMPLANT

## 2022-01-09 NOTE — H&P (Addendum)
[name] is an 66 y.o. male.   ?Chief Complaint: carpal tunnel syndrome ?HPI: 66 yo male with numbness and tingling right hand.  Positive nerve conduction studies.  Numbness and tingling in fingers.  Nocturnal symptoms.  He wishes to have right carpal tunnel release. ? ?Allergies:  ?Allergies  ?Allergen Reactions  ? Oxycontin [Oxycodone] Other (See Comments)  ?  Sweating, delusional  ? ? ?Past Medical History:  ?Diagnosis Date  ? Arthritis   ? Coronary artery disease 07/2020  ? +MI stent to LAD  ? Dysrhythmia   ? hx af when thyroid was abnormal-2006  ? Hypertension   ? Hypothyroidism   ? Sleep apnea   ? used to use cpap-not anymore  ? ? ?Past Surgical History:  ?Procedure Laterality Date  ? CARDIAC CATHETERIZATION    ? COLONOSCOPY    ? KNEE ARTHROSCOPY  10/12/1993  ? right  ? KNEE ARTHROSCOPY  10/12/1998  ? left  ? SHOULDER ARTHROSCOPY  10/13/2003  ? right and left  ? SHOULDER ARTHROSCOPY WITH OPEN ROTATOR CUFF REPAIR AND DISTAL CLAVICLE ACROMINECTOMY Left 09/19/2014  ? Procedure: LEFT SHOULDER ARTHROSCOPY DEBRIDEMENT WITH SUBACROMIAL DECOMPRESSION  DISTAL CLAVICLE RESECTIONOPEN ROTATOR CUFF REPAIR ;  Surgeon: Yvette Rack., MD;  Location: [facility];  Service: Orthopedics;  Laterality: Left;  ? UVULOPALATOPHARYNGOPLASTY, TONSILLECTOMY AND SEPTOPLASTY  10/12/1996  ? ? ?Family History: ?History reviewed. No pertinent family history. ? ?Social History:  ? reports that he quit smoking about 27 years ago. His smoking use included cigarettes. He does not have any smokeless tobacco history on file. He reports current alcohol use. He reports that he does not use drugs. ? ?Medications: ?Medications Prior to Admission  ?Medication Sig Dispense Refill  ? amLODipine (NORVASC) 5 MG tablet Take 5 mg by mouth daily.    ? aspirin 81 MG tablet Take 81 mg by mouth daily.    ? hydrochlorothiazide (MICROZIDE) 12.5 MG capsule Take 12.5 mg by mouth daily.    ? levothyroxine (SYNTHROID) 125 MCG tablet Take  125 mcg by mouth daily before breakfast.    ? metoprolol succinate (TOPROL-XL) 25 MG 24 hr tablet Take 25 mg by mouth daily.    ? Multiple Vitamins-Minerals (MULTIVITAMIN WITH MINERALS) tablet Take 1 tablet by mouth daily.    ? rosuvastatin (CRESTOR) 5 MG tablet Take 5 mg by mouth daily.    ? testosterone cypionate (DEPOTESTOSTERONE CYPIONATE) 200 MG/ML injection Inject 100 mg into the muscle every 30 (thirty) days.    ? nitroGLYCERIN (NITROSTAT) 0.4 MG SL tablet Place 0.4 mg under the tongue every 5 (five) minutes as needed for chest pain.    ? ? ?No results found for this or any previous visit (from the past 48 hour(s)). ? ?No results found. ? ? ? ?Blood pressure (!) 157/89, pulse (!) 55, temperature 98 ?F (36.7 ?C), temperature source Oral, resp. rate 13, height '5\' 7"'$  (1.702 m), weight 78.4 kg, SpO2 97 %. ? ?General appearance: alert, cooperative, and appears stated age ?Head: Normocephalic, without obvious abnormality, atraumatic ?Neck: supple, symmetrical, trachea midline ?Extremities: Intact sensation and capillary refill all digits.  +epl/fpl/io.  No wounds.  ?Pulses: 2+ and symmetric ?Skin: Skin color, texture, turgor normal. No rashes or lesions ?Neurologic: Grossly normal ?Incision/Wound: none ? ?Assessment/Plan ?Right carpal tunnel syndrome.  Non operative and operative treatment options have been discussed with the patient and patient wishes to proceed with operative treatment. Risks, benefits, and alternatives of surgery have been discussed and the patient  agrees with the plan of care.  ? ?Theodore Moore ?01/09/2022, 12:18 PM ? ?

## 2022-01-09 NOTE — Progress Notes (Signed)
Assisted Dr. Roanna Banning with left and right, transabdominal plane, ultrasound guided blocks. Side rails up, monitors on throughout procedure. See vital signs in flow sheet. Tolerated Procedure well. ?

## 2022-01-09 NOTE — Transfer of Care (Signed)
Immediate Anesthesia Transfer of Care Note ? ?Patient: Theodore Moore ? ?Procedure(s) Performed: RIGHT CARPAL TUNNEL RELEASE (Right: Hand) ?BILATERAL INGUINAL HERNIA REPAIRS WITH MESH (Bilateral: Groin) ? ?Patient Location: PACU ? ?Anesthesia Type:GA combined with regional for post-op pain ? ?Level of Consciousness: drowsy ? ?Airway & Oxygen Therapy: Patient Spontanous Breathing and Patient connected to face mask oxygen ? ?Post-op Assessment: Report given to RN and Post -op Vital signs reviewed and stable ? ?Post vital signs: Reviewed and stable ? ?Last Vitals:  ?Vitals Value Taken Time  ?BP 145/79 01/09/22 1528  ?Temp    ?Pulse 59 01/09/22 1530  ?Resp 9 01/09/22 1530  ?SpO2 99 % 01/09/22 1530  ?Vitals shown include unvalidated device data. ? ?Last Pain:  ?Vitals:  ? 01/09/22 1135  ?TempSrc: Oral  ?PainSc: 0-No pain  ?   ? ?Patients Stated Pain Goal: 7 (01/09/22 1135) ? ?Complications: No notable events documented. ?

## 2022-01-09 NOTE — Anesthesia Procedure Notes (Signed)
Procedure Name: Intubation ?Date/Time: 01/09/2022 12:49 PM ?Performed by: Lavonia Dana, CRNA ?Pre-anesthesia Checklist: Patient identified, Emergency Drugs available, Suction available and Patient being monitored ?Patient Re-evaluated:Patient Re-evaluated prior to induction ?Oxygen Delivery Method: Circle system utilized ?Preoxygenation: Pre-oxygenation with 100% oxygen ?Induction Type: IV induction ?Ventilation: Mask ventilation without difficulty ?Laryngoscope Size: Mac and 4 ?Grade View: Grade I ?Tube type: Oral ?Tube size: 7.5 mm ?Number of attempts: 1 ?Airway Equipment and Method: Stylet and Bite block ?Placement Confirmation: ETT inserted through vocal cords under direct vision, positive ETCO2 and breath sounds checked- equal and bilateral ?Secured at: 22 cm ?Tube secured with: Tape ?Dental Injury: Teeth and Oropharynx as per pre-operative assessment  ? ? ? ? ?

## 2022-01-09 NOTE — Op Note (Addendum)
01/09/2022 ?[place] ? ?                            OPERATIVE REPORT ? ? ?PREOPERATIVE DIAGNOSIS:  Right carpal tunnel syndrome. ? ?POSTOPERATIVE DIAGNOSIS:  Right carpal tunnel syndrome. ? ?PROCEDURE:  Right carpal tunnel release. ? ?SURGEON:  Leanora Cover, MD ? ?ASSISTANT:  Jacobo Forest, [facility]. ? ?ANESTHESIA: General ? ?IV FLUIDS:  Per anesthesia flow sheet. ? ?ESTIMATED BLOOD LOSS:  Minimal. ? ?COMPLICATIONS:  None. ? ?SPECIMENS:  None. ? ?TOURNIQUET TIME:   ? ?Total Tourniquet Time Documented: ?Upper Arm (Right) - 13 minutes ?Total: Upper Arm (Right) - 13 minutes ? ? ?DISPOSITION:  Stable to PACU. ? ?LOCATION: [facility] ? ?INDICATIONS:  66 yo male with numbness and tingling right hand.  Nocturnal symptoms.  Positive nerve conduction studies.  He wishes to have a carpal tunnel release for management of his symptoms.  Risks, benefits and alternatives of surgery were discussed including the risk of blood loss; infection; damage to nerves, vessels, tendons, ligaments, bone; failure of surgery; need for additional surgery; complications with wound healing; continued pain; recurrence of carpal tunnel syndrome; and damage to motor branch. He voiced understanding of these risks and elected to proceed.  ? ?OPERATIVE COURSE:  After being identified preoperatively by myself, the patient and I agreed upon the procedure and site of procedure.  The surgical site was marked.  Surgical consent had been signed.  He was given IV Ancef as preoperative antibiotic prophylaxis.  He was transferred to the operating room and placed on the operating room table in supine position with the right upper extremity on an armboard.  General anesthesia was induced by the anesthesiologist.  Right upper extremity was prepped and draped in normal sterile orthopaedic fashion.  A surgical pause was performed between the surgeons, anesthesia, and operating room staff, and all were in agreement as to the patient,  procedure, and site of procedure.  Tourniquet at the proximal aspect of the extremity was inflated to 250 mmHg after exsanguination of the arm with an Esmarch bandage  Incision was made over the transverse carpal ligament and carried into the subcutaneous tissues by spreading technique.  Bipolar electrocautery was used to obtain hemostasis.  The palmar fascia was sharply incised.  The transverse carpal ligament was identified and sharply incised.  It was incised distally first.  The flexor tendons were identified.  The flexor tendon to the ring finger was identified and retracted radially.  The transverse carpal ligament was then incised proximally.  Scissors were used to split the distal aspect of the volar antebrachial fascia.  A finger was placed into the wound to ensure complete decompression, which was the case.  The nerve was examined.  There was a persistent median artery.  The motor branch was identified and was intact.  The wound was copiously irrigated with sterile saline.  It was then closed with 4-0 nylon in a horizontal mattress fashion.  It was injected with 0.25% plain Marcaine to aid in postoperative analgesia.  It was dressed with sterile Xeroform, 4x4s, an ABD, and wrapped with Kerlix and an Ace bandage.  Tourniquet was deflated at 13 minutes.  Fingertips were pink with brisk capillary refill after deflation of the tourniquet.  Operative drapes were broken down.  He remained in the OR for general surgical procedure dictated under separate note. ? ? ? ?Leanora Cover, MD ?Electronically signed, 01/09/22 ? ?

## 2022-01-09 NOTE — Anesthesia Preprocedure Evaluation (Signed)
Anesthesia Evaluation  ?Patient identified by MRN, date of birth, ID band ?Patient awake ? ? ? ?Reviewed: ?Allergy & Precautions, NPO status , Patient's Chart, lab work & pertinent test results ? ?Airway ?Mallampati: I ? ?TM Distance: >3 FB ?Neck ROM: Full ? ? ? Dental ? ?(+) Partial Upper ?  ?Pulmonary ?sleep apnea , former smoker,  ?  ?Pulmonary exam normal ?breath sounds clear to auscultation ? ? ? ? ? ? Cardiovascular ?hypertension, Pt. on home beta blockers and Pt. on medications ?+ CAD and + Cardiac Stents  ?Normal cardiovascular exam ?Rhythm:Regular Rate:Normal ? ?ECG: SB, rate 58 ?  ?Neuro/Psych ?negative neurological ROS ? negative psych ROS  ? GI/Hepatic ?negative GI ROS, Neg liver ROS,   ?Endo/Other  ?Hypothyroidism  ? Renal/GU ?Renal disease  ? ?  ?Musculoskeletal ? ?(+) Arthritis ,  ? Abdominal ?  ?Peds ? Hematology ?negative hematology ROS ?(+)   ?Anesthesia Other Findings ?BILATERAL INGUINAL HERNIAS  ?RIGHT CARPAL TUNNEL SYNDROME ? Reproductive/Obstetrics ? ?  ? ? ? ? ? ? ? ? ? ? ? ? ? ?  ?  ? ? ? ? ? ? ? ? ?Anesthesia Physical ?Anesthesia Plan ? ?ASA: 3 ? ?Anesthesia Plan: General and Regional  ? ?Post-op Pain Management:   ? ?Induction: Intravenous ? ?PONV Risk Score and Plan: 2 and Ondansetron, Dexamethasone, Midazolam and Treatment may vary due to age or medical condition ? ?Airway Management Planned: Oral ETT ? ?Additional Equipment:  ? ?Intra-op Plan:  ? ?Post-operative Plan: Extubation in OR ? ?Informed Consent: I have reviewed the patients History and Physical, chart, labs and discussed the procedure including the risks, benefits and alternatives for the proposed anesthesia with the patient or authorized representative who has indicated his/her understanding and acceptance.  ? ? ? ?Dental advisory given ? ?Plan Discussed with: CRNA ? ?Anesthesia Plan Comments:   ? ? ? ? ? ? ?Anesthesia Quick Evaluation ? ?

## 2022-01-09 NOTE — Op Note (Signed)
01/09/2022 ? ?12:26 PM ? ?PATIENT:  Theodore Moore  66 y.o. male ? ?PRE-OPERATIVE DIAGNOSIS:  BILATERAL INGUINAL HERNIAS ? ?POST-OPERATIVE DIAGNOSIS:  BILATERAL INGUINAL HERNIAS ? ?PROCEDURE:  Procedure(s): ?BILATERAL INGUINAL HERNIA REPAIRS WITH MESH (Bilateral) ? ?SURGEON:  Surgeon(s) and Role: ?    Jovita Kussmaul, MD - Primary ? ?PHYSICIAN ASSISTANT:  ? ?ASSISTANTS: none  ? ?ANESTHESIA:   local and general ? ?EBL:  Minimal  ? ?BLOOD ADMINISTERED:none ? ?DRAINS: none  ? ?LOCAL MEDICATIONS USED:  MARCAINE    ? ?SPECIMEN:  No Specimen ? ?DISPOSITION OF SPECIMEN:  N/A ? ?COUNTS:  YES ? ?TOURNIQUET:  * Missing tourniquet times found for documented tourniquets in log: [id number] * ? ?DICTATION: .Dragon Dictation ? ?After informed consent was obtained the patient was brought to the operating room and placed in the supine position on the operating table.  After adequate induction of general anesthesia the patient's abdomen and bilateral groins were prepped with ChloraPrep, allowed to dry, and draped in usual sterile manner.  An appropriate timeout was performed.  Each groin area was then infiltrated with quarter percent Marcaine.  Attention was first turned to the left groin.  A small incision was made from the edge of the pubic tubercle on the left towards the anterior superior iliac spine.  The incision was carried through the skin and subcutaneous tissue sharply with the electrocautery until the fascia of the external oblique was encountered.  A small bridging vein was clamped with hemostats, divided, and ligated with 3-0 silk ties.  The external bleak fascia was opened along its fibers towards the apex of the external ring with a 15 blade knife and Metzenbaum scissors.  We then retractor was deployed.  Blunt dissection was carried out of the cord structures until they could be surrounded between 2 fingers.  Half inch Penrose drain was placed around the cord structures for retraction purposes.  The cord was then gently  skeletonized.  There was a small lipoma of the cord that was excised sharply with the electrocautery.  There was no obvious hernia associated with the cord itself.  There was a broad defect with bulging in the floor of the canal medially.  This was reduced and the floor of the canal was repaired with interrupted 0 Vicryl stitches.  A 3 x 6 piece of ultra Pro mesh was then chosen and cut to the appropriate size.  During the dissection the ilioinguinal nerve was identified and involved with some scar tissue.  It was dissected out proximally and distally, clamped, divided, and ligated with 3-0 silk ties.  Next the mesh was sewed inferiorly to the shelving edge of the inguinal ligament with a running 2-0 Prolene stitch.  Tails were cut in the mesh laterally and the tails were wrapped around the cord structures.  Medially and superiorly the mesh was sewed to the muscular a parotic strength layer of the transversalis with interrupted 2-0 Prolene vertical mattress stitches.  Lateral to the cord the tails of the mesh were anchored to the shelving edge of the inguinal ligament with interrupted 2-0 Prolene stitches.  Once this was accomplished the mesh appeared to be in good position the hernia seem well repaired.  Care was taken to avoid injury to the vas deferens and testicular artery.  The wound was then irrigated with copious amounts of saline.  The external bleak fascia was reapproximated with a running 2-0 Vicryl stitch.  The subcutaneous fascia was closed with a running 3-0 Vicryl stitch.  The skin was then closed with a running 4-0 Monocryl subcuticular stitch.  Attention was then turned to the right groin.  A similar incision was made from the edge of the pubic tubercle towards the anterior superior iliac spine.  The incision was carried through the skin and subcutaneous tissue sharply with the electrocautery until the fascia of the external oblique was encountered.  A small bridging vein was clamped with hemostats,  divided, and ligated with 3-0 silk ties.  The external bleak fascia was opened along its fibers towards the apex of the external ring with a 15 blade knife and Metzenbaum scissors.  A [place] retractor was deployed.  Blunt dissection was carried out of the cord structures until they could be surrounded between 2 fingers.  Half inch Penrose drain was placed around the cord structures for retraction purposes.  Again the cord was dissected bluntly and no obvious hernia sac was identified associated with the cord.  During the dissection the ilioinguinal nerve was identified and involved with some scar tissue.  It was dissected out proximally distally, clamped, divided, and ligated with 3-0 silk ties.  There was a small bulge and defect in the medial floor of the canal.  The bulge was reduced and the floor of the canal was repaired with interrupted 0 Vicryl stitches.  A 3 x 6 piece of ultra Pro mesh was chosen and cut to the appropriate size.  The mesh was sewed inferiorly to the shelving edge of the inguinal ligament with a running 2-0 Prolene stitch.  Tails were cut in the mesh laterally and the tails were wrapped around the cord structures.  Medially and superiorly the mesh was sewed to the muscular oh a parotic strength layer of the transversalis with interrupted 2-0 Prolene vertical mattress stitches.  Lateral to the cord the tails of the mesh were anchored to the shelving edge of the inguinal ligament with interrupted 2-0 Prolene stitches.  Once this was accomplished the mesh appeared to be in good position the hernia seem well repaired.  The wound was irrigated with copious amounts of saline.  The external oblique fascia was closed with a running 2-0 Vicryl stitch.  The subcutaneous fascia was closed with a running 3-0 Vicryl stitch.  The skin was then closed with a running 4-0 Monocryl subcuticular stitch.  Dermabond dressings were applied.  The patient tolerated the procedure well.  At the end of the case all  needle sponge and instrument counts were correct.  The patient was then awakened and taken to recovery in stable condition.  The patient's testicles were in the scrotum at the end of the case. ? ?PLAN OF CARE: Discharge to home after PACU ? ?PATIENT DISPOSITION:  PACU - hemodynamically stable. ?  ?Delay start of Pharmacological VTE agent (>24hrs) due to surgical blood loss or risk of bleeding: not applicable ? ?

## 2022-01-09 NOTE — Anesthesia Postprocedure Evaluation (Signed)
Anesthesia Post Note ? ?Patient: Theodore Moore ? ?Procedure(s) Performed: RIGHT CARPAL TUNNEL RELEASE (Right: Hand) ?BILATERAL INGUINAL HERNIA REPAIRS WITH MESH (Bilateral: Groin) ? ?  ? ?Patient location during evaluation: PACU ?Anesthesia Type: Regional and General ?Level of consciousness: awake ?Pain management: pain level controlled ?Vital Signs Assessment: post-procedure vital signs reviewed and stable ?Respiratory status: spontaneous breathing, nonlabored ventilation, respiratory function stable and patient connected to nasal cannula oxygen ?Cardiovascular status: blood pressure returned to baseline and stable ?Postop Assessment: no apparent nausea or vomiting ?Anesthetic complications: no ? ? ?No notable events documented. ? ?Last Vitals:  ?Vitals:  ? 01/09/22 1600 01/09/22 1637  ?BP: (!) 149/86 (!) 162/91  ?Pulse: 73 67  ?Resp: 13   ?Temp:  36.5 ?C  ?SpO2: 97% 97%  ?  ?Last Pain:  ?Vitals:  ? 01/09/22 1637  ?TempSrc:   ?PainSc: 2   ? ? ?  ?  ?  ?  ?  ?  ? ?Thi Sisemore P Lache Dagher ? ? ? ? ?

## 2022-01-09 NOTE — Anesthesia Procedure Notes (Signed)
Anesthesia Regional Block: TAP block  ? ?Pre-Anesthetic Checklist: , timeout performed,  Correct Patient, Correct Site, Correct Laterality,  Correct Procedure, Correct Position, site marked,  Risks and benefits discussed,  Surgical consent,  Pre-op evaluation,  At surgeon's request and post-op pain management ? ?Laterality: Right ? ?Prep: chloraprep     ?  ?Needles:  ?Injection technique: Single-shot ? ?Needle Type: Echogenic Stimulator Needle   ? ? ?Needle Length: 10cm  ?Needle Gauge: 20  ? ? ? ?Additional Needles: ? ? ?Procedures:,,,, ultrasound used (permanent image in chart),,    ?Narrative:  ?Start time: 01/09/2022 11:15 AM ?End time: 01/09/2022 11:25 AM ?Injection made incrementally with aspirations every 5 mL. ? ?Performed by: Personally  ?Anesthesiologist: Murvin Natal, MD ? ?Additional Notes: ?Functioning IV was confirmed and monitors were applied.  A timeout was performed. Sterile prep, hand hygiene and sterile gloves were used. A 161m 20ga Bbraun echogenic stimulator needle was used. Negative aspiration and negative test dose prior to incremental administration of local anesthetic. The patient tolerated the procedure well. ? ?Ultrasound guidance: relevent anatomy identified, needle position confirmed, local anesthetic spread visualized around nerve(s), vascular puncture avoided.  Image printed for medical record.  ? ? ? ? ? ? ?

## 2022-01-09 NOTE — Anesthesia Procedure Notes (Signed)
Anesthesia Regional Block: TAP block  ? ?Pre-Anesthetic Checklist: , timeout performed,  Correct Patient, Correct Site, Correct Laterality,  Correct Procedure, Correct Position, site marked,  Risks and benefits discussed,  Surgical consent,  Pre-op evaluation,  At surgeon's request and post-op pain management ? ?Laterality: Left ? ?Prep: chloraprep     ?  ?Needles:  ?Injection technique: Single-shot ? ?Needle Type: Echogenic Stimulator Needle   ? ? ?Needle Length: 10cm  ?Needle Gauge: 20  ? ? ? ?Additional Needles: ? ? ?Procedures:,,,, ultrasound used (permanent image in chart),,    ?Narrative:  ?Start time: 01/09/2022 11:05 AM ?End time: 01/09/2022 11:15 AM ?Injection made incrementally with aspirations every 5 mL. ? ?Performed by: Personally  ?Anesthesiologist: Murvin Natal, MD ? ?Additional Notes: ?Functioning IV was confirmed and monitors were applied.  A timeout was performed. Sterile prep, hand hygiene and sterile gloves were used. A 179m 20ga Bbraun echogenic stimulator needle was used. Negative aspiration and negative test dose prior to incremental administration of local anesthetic. The patient tolerated the procedure well. ? ?Ultrasound guidance: relevent anatomy identified, needle position confirmed, local anesthetic spread visualized around nerve(s), vascular puncture avoided.  Image printed for medical record.  ? ? ? ? ? ?

## 2022-01-09 NOTE — H&P (Signed)
?REFERRING PHYSICIAN: Oren Beckmann, MD ? ?PROVIDER: Landry Corporal, MD ? ?MRN: [id number] ?DOB: [birth year] ?Subjective  ? ?Chief Complaint: Hernia ? ? ?History of Present Illness: ?Theodore Moore is a 66 y.o. male who is seen today as an office consultation at the request of Dr. Sheryn Bison for evaluation of Hernia ?.  ? ?We are asked to see the patient in consultation by Dr. Aura Dials to evaluate him for a left inguinal hernia. The patient is a 66 year old white male who has been experiencing some discomfort and bulging in the left groin for the last year. He does not remember a particular event that started it but does lift a lot of heavy items at work. He also has a smaller bulge with less tenderness on the right side. He denies any nausea or vomiting. He denies any fevers or chills. He quit smoking 30 years ago. He does have a cardiac stent that was put in 1 year ago but he is now off the blood thinner and just takes a baby aspirin ? ?Review of Systems: ?A complete review of systems was obtained from the patient. I have reviewed this information and discussed as appropriate with the patient. See HPI as well for other ROS. ? ?ROS  ? ?Medical History: ?History reviewed. No pertinent past medical history. ? ?Patient Active Problem List  ?Diagnosis  ? Non-recurrent bilateral inguinal hernia without obstruction or gangrene  ? ?History reviewed. No pertinent surgical history.  ? ?Allergies  ?Allergen Reactions  ? Oxycodone Other (See Comments) and Unknown  ?ANXIETY, DIAPHORESIS ?ANXIETY, DIAPHORESIS ? ? ?Current Outpatient Medications on File Prior to Visit  ?Medication Sig Dispense Refill  ? hydroCHLOROthiazide (HYDRODIURIL) 12.5 MG tablet Take 1 tablet by mouth once daily  ? meloxicam (MOBIC) 15 MG tablet 1 tablet  ? testosterone cypionate (DEPO-TESTOSTERONE) 200 mg/mL injection 1 ml  ? aspirin 81 MG EC tablet Take by mouth once daily  ? levothyroxine (SYNTHROID) 125 MCG tablet Take 1 tablet by mouth once  daily  ? metoprolol succinate (TOPROL-XL) 50 MG XL tablet Take 50 mg by mouth once daily  ? multivitamin tablet Take 1 tablet by mouth once daily  ? multivitamin with minerals tablet Take 1 tablet by mouth once daily  ? multivitamin-Ca-iron-minerals Tab Take 1 tablet by mouth once daily  ? rosuvastatin (CRESTOR) 5 MG tablet Take 5 mg by mouth at bedtime  ? ?No current facility-administered medications on file prior to visit.  ? ?History reviewed. No pertinent family history.  ? ?Social History  ? ?Tobacco Use  ?Smoking Status Some Days  ? Types: Cigarettes  ?Smokeless Tobacco Never  ? ? ?Social History  ? ?Socioeconomic History  ? Marital status: Unknown  ?Tobacco Use  ? Smoking status: Some Days  ?Types: Cigarettes  ? Smokeless tobacco: Never  ?Vaping Use  ? Vaping Use: Never used  ?Substance and Sexual Activity  ? Alcohol use: Yes  ? Drug use: Yes  ? ?Objective:  ? ?Vitals:   ?BP: 138/86  ?Pulse: 65  ?Temp: 37.4 ?C (99.3 ?F)  ?SpO2: 98%  ?Weight: 82.9 kg (182 lb 12.8 oz)  ?Height: 170.2 cm ('5\' 7"'$ )  ? ?Body mass index is 28.63 kg/m?. ? ?Physical Exam ?Constitutional:  ?General: He is not in acute distress. ?Appearance: Normal appearance.  ?HENT:  ?Head: Normocephalic and atraumatic.  ?Right Ear: External ear normal.  ?Left Ear: External ear normal.  ?Nose: Nose normal.  ?Mouth/Throat:  ?Mouth: Mucous membranes are moist.  ?Pharynx: Oropharynx is clear.  ?  Eyes:  ?General: No scleral icterus. ?Extraocular Movements: Extraocular movements intact.  ?Conjunctiva/sclera: Conjunctivae normal.  ?Pupils: Pupils are equal, round, and reactive to light.  ?Cardiovascular:  ?Rate and Rhythm: Normal rate and regular rhythm.  ?Pulses: Normal pulses.  ?Heart sounds: Normal heart sounds.  ?Pulmonary:  ?Effort: Pulmonary effort is normal. No respiratory distress.  ?Breath sounds: Normal breath sounds.  ?Abdominal:  ?General: Abdomen is flat. Bowel sounds are normal. There is no distension.  ?Palpations: Abdomen is soft.   ?Tenderness: There is no abdominal tenderness.  ?Genitourinary: ?Comments: There are small bulges that reduces easily in both groins. ?Musculoskeletal:  ?General: No swelling or deformity. Normal range of motion.  ?Cervical back: Normal range of motion and neck supple. No tenderness.  ?Skin: ?General: Skin is warm and dry.  ?Coloration: Skin is not jaundiced.  ?Neurological:  ?General: No focal deficit present.  ?Mental Status: He is alert and oriented to person, place, and time.  ?Psychiatric:  ?Mood and Affect: Mood normal.  ?Behavior: Behavior normal.  ? ? ? ?Labs, Imaging and Diagnostic Testing: ? ?Assessment and Plan:  ? ?Diagnoses and all orders for this visit: ? ?Non-recurrent bilateral inguinal hernia without obstruction or gangrene ?- [facility] Case Posting Request; Future ? ? ? ?The patient appears to have small but symptomatic bilateral inguinal hernias. Because of the risk of incarceration and strangulation I feel he would benefit from having these fixed. He would also like to have these done. I have discussed with him in detail the risks and benefits of the operation as well as some of the technical aspects including the use of mesh and the risk of chronic pain and he understands and wishes to proceed. He is thinking about having his carpal tunnel surgery done as well and is wondering if this could be coordinated to be done at the same time. We can reach out to his orthopedic doctor about this  ?

## 2022-01-09 NOTE — Discharge Instructions (Signed)
No Tylenol before 5:45pm if needed. ?No ibuprofen before 7:45pm if needed. ? ? ?Post Anesthesia Home Care Instructions ? ?Activity: ?Get plenty of rest for the remainder of the day. A responsible individual must stay with you for 24 hours following the procedure.  ?For the next 24 hours, DO NOT: ?-Drive a car ?-Paediatric nurse ?-Drink alcoholic beverages ?-Take any medication unless instructed by your physician ?-Make any legal decisions or sign important papers. ? ?Meals: ?Start with liquid foods such as gelatin or soup. Progress to regular foods as tolerated. Avoid greasy, spicy, heavy foods. If nausea and/or vomiting occur, drink only clear liquids until the nausea and/or vomiting subsides. Call your physician if vomiting continues. ? ?Special Instructions/Symptoms: ?Your throat may feel dry or sore from the anesthesia or the breathing tube placed in your throat during surgery. If this causes discomfort, gargle with warm salt water. The discomfort should disappear within 24 hours. ? ?If you had a scopolamine patch placed behind your ear for the management of post- operative nausea and/or vomiting: ? ?1. The medication in the patch is effective for 72 hours, after which it should be removed.  Wrap patch in a tissue and discard in the trash. Wash hands thoroughly with soap and water. ?2. You may remove the patch earlier than 72 hours if you experience unpleasant side effects which may include dry mouth, dizziness or visual disturbances. ?3. Avoid touching the patch. Wash your hands with soap and water after contact with the patch. ?    ? ?

## 2022-01-09 NOTE — Interval H&P Note (Signed)
History and Physical Interval Note: ? ?01/09/2022 ?11:53 AM ? ?Theodore Moore  has presented today for surgery, with the diagnosis of [place] ?RIGHT CARPAL TUNNEL SYNDROME.  The various methods of treatment have been discussed with the patient and family. After consideration of risks, benefits and other options for treatment, the patient has consented to  Procedure(s): ?BILATERAL INGUINAL HERNIA REPAIRS WITH MESH (Bilateral) ?CARPAL TUNNEL RELEASE (Right) as a surgical intervention.  The patient's history has been reviewed, patient examined, no change in status, stable for surgery.  I have reviewed the patient's chart and labs.  Questions were answered to the patient's satisfaction.   ? ? ?Theodore Moore ? ? ?

## 2022-01-12 ENCOUNTER — Encounter (HOSPITAL_BASED_OUTPATIENT_CLINIC_OR_DEPARTMENT_OTHER): Payer: Self-pay | Admitting: Orthopedic Surgery

## 2022-01-16 DIAGNOSIS — G5603 Carpal tunnel syndrome, bilateral upper limbs: Secondary | ICD-10-CM | POA: Diagnosis not present

## 2022-01-20 IMAGING — CR DG LUMBAR SPINE COMPLETE 4+V
5 series · 5 of 5 positions shown · non-contrast
Comparison: None.

CLINICAL DATA: Right buttock pain radiating to right thigh for 2
months

EXAM:
LUMBAR SPINE - COMPLETE 4+ VIEW

[t lumbar spine ap]
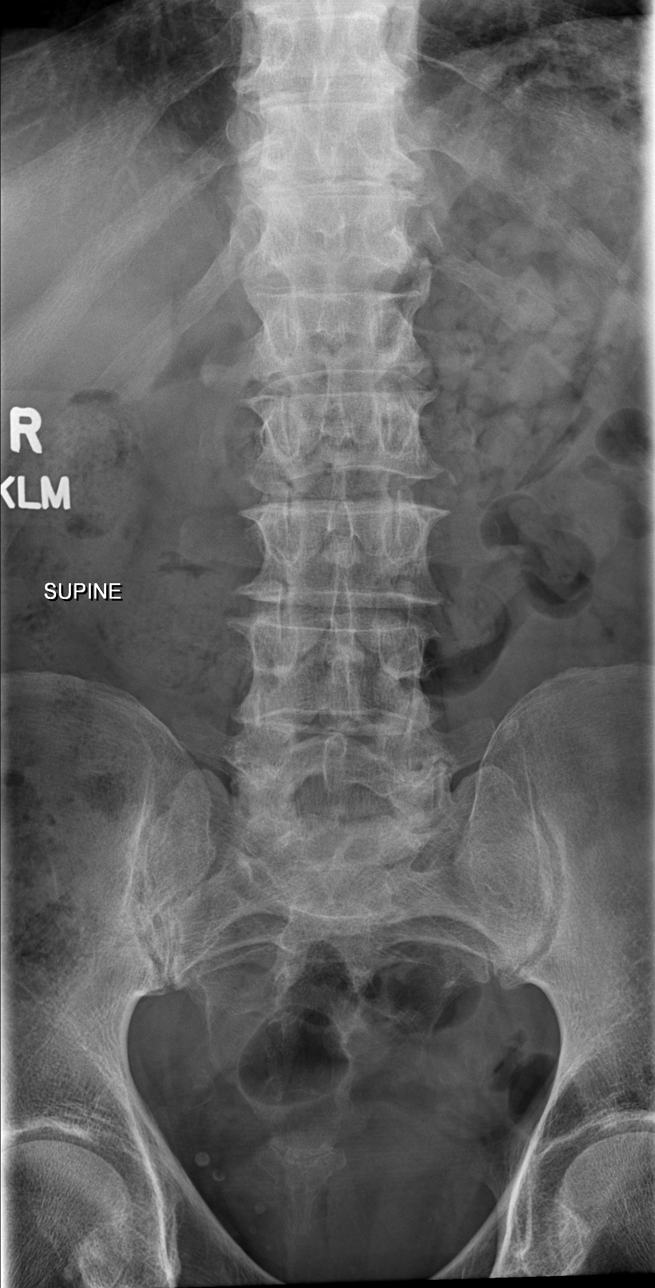

[t lumbar spine obl (1 of 2)]
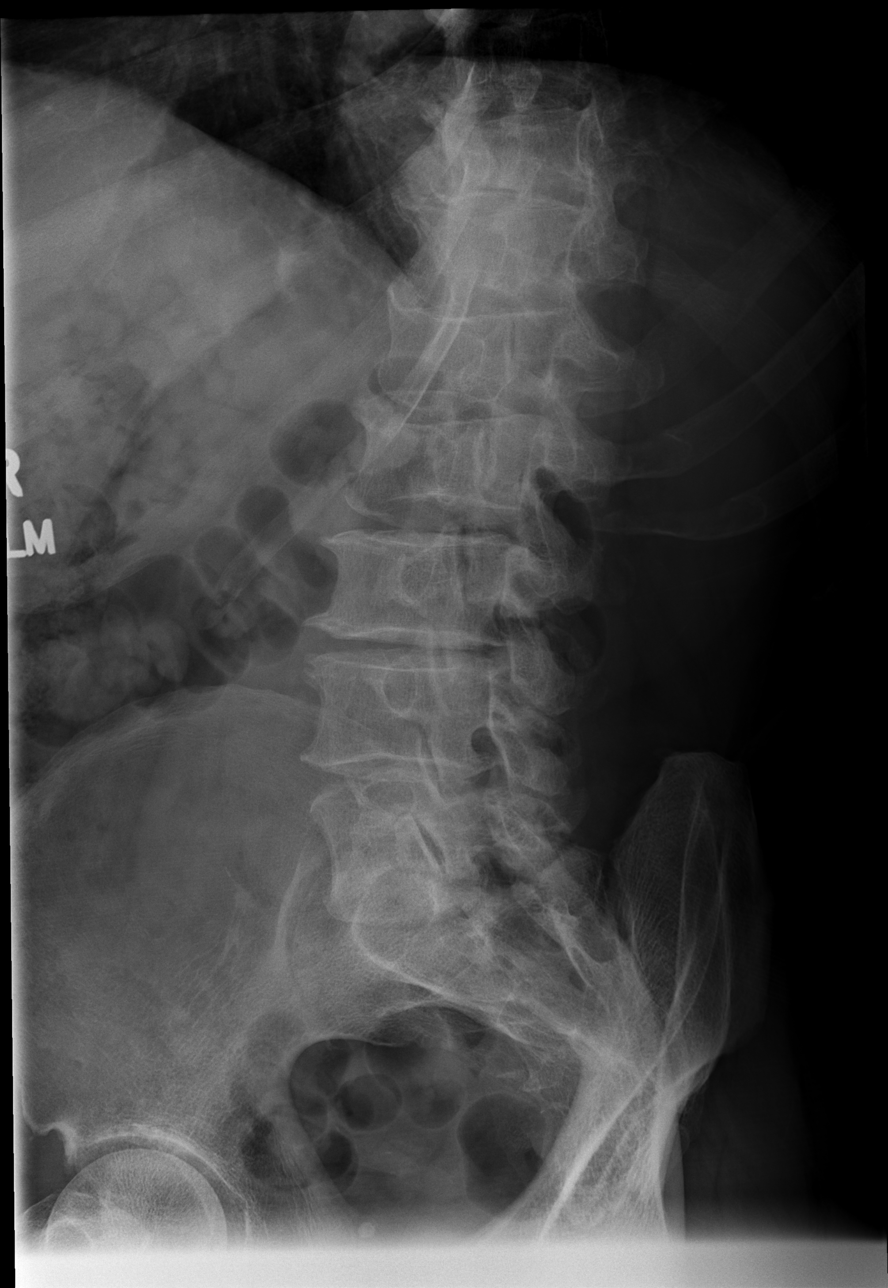

[t lumbar spine obl (2 of 2)]
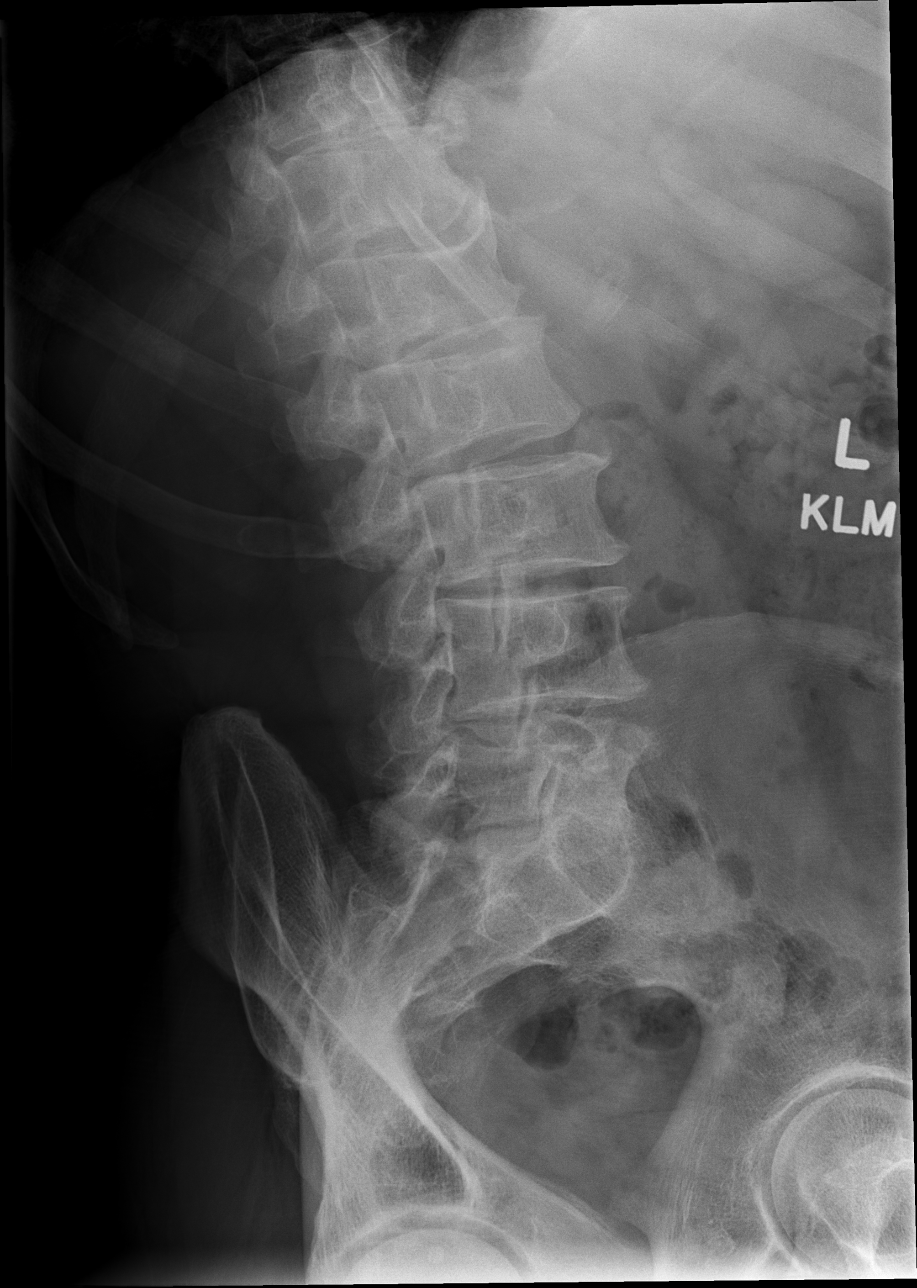

[t lumbar spine lat]
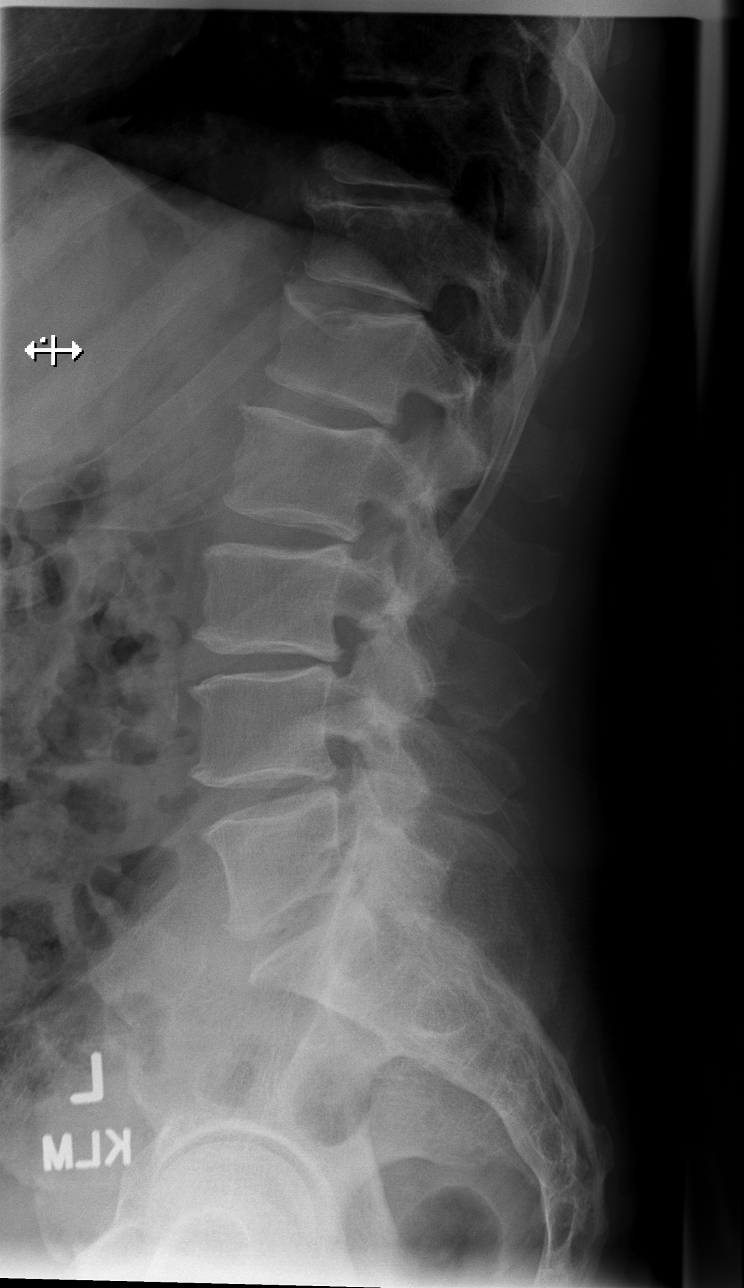

[t lumbar l-5 s-1 spot]
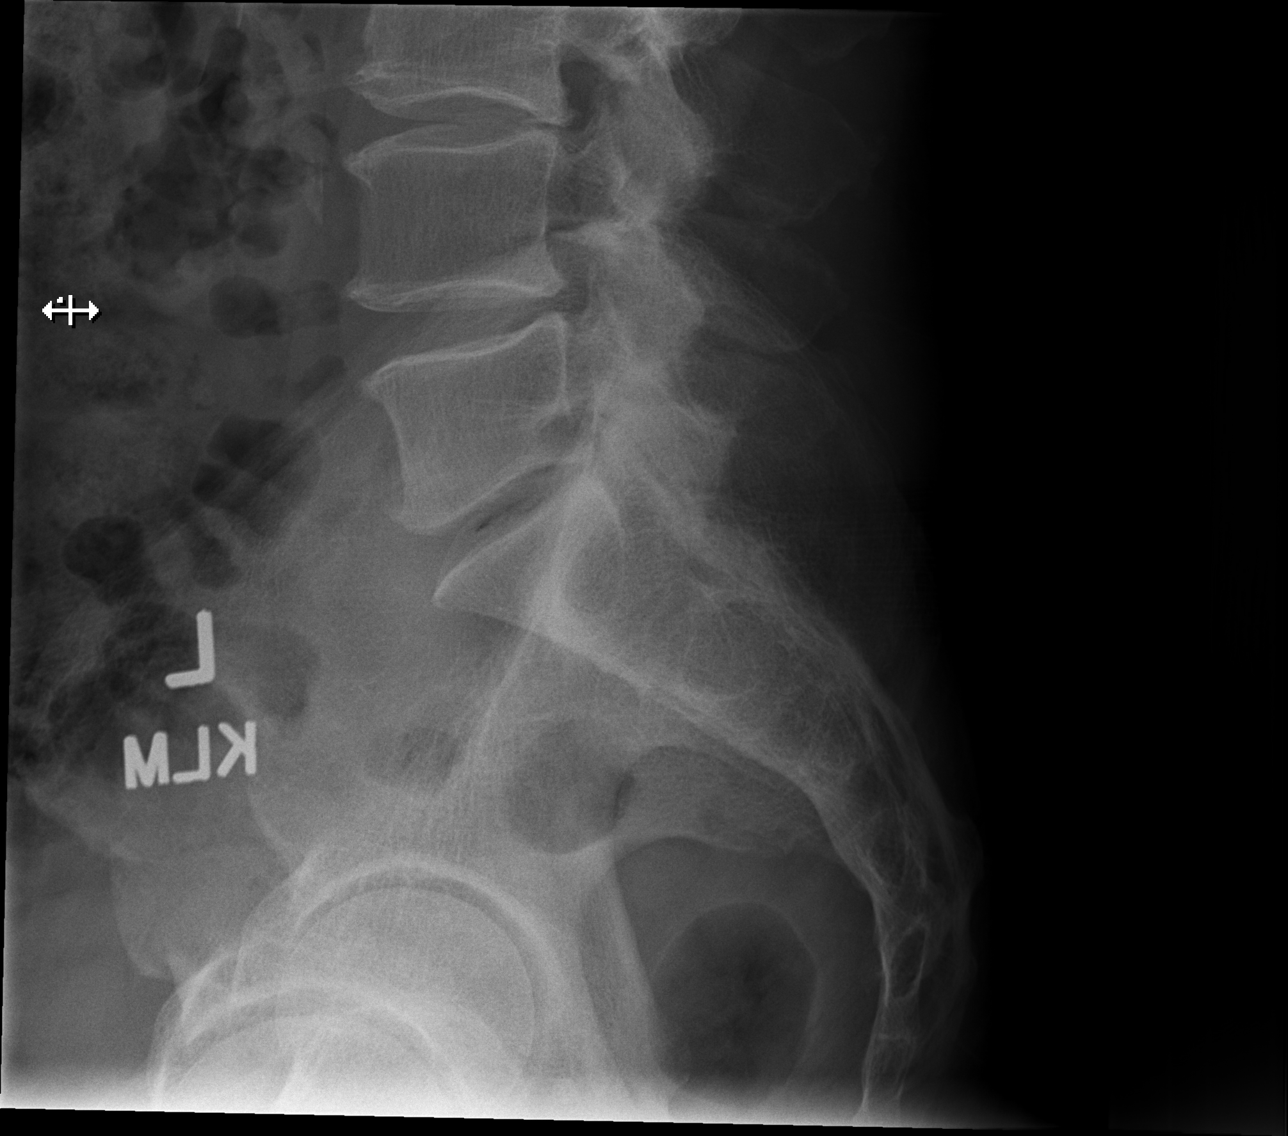

[5 of 5 positions shown; findings below may reference images not displayed]

FINDINGS: Frontal, bilateral oblique, lateral views of the lumbar spine are
obtained. There are 5 non-rib-bearing lumbar type vertebral bodies
in grossly normal alignment. Prominent Schmorl's node within the
superior endplate of the L1 vertebral body again noted. No acute
fractures. There is multilevel spondylosis and facet hypertrophy,
greatest at L3-4 and L5-S1. Sacroiliac joints are normal.
IMPRESSION: 1. Mild lower lumbar spondylosis and facet hypertrophy. No acute
bony abnormality.

## 2022-02-03 DIAGNOSIS — K4020 Bilateral inguinal hernia, without obstruction or gangrene, not specified as recurrent: Secondary | ICD-10-CM | POA: Diagnosis not present

## 2022-02-23 DIAGNOSIS — G5603 Carpal tunnel syndrome, bilateral upper limbs: Secondary | ICD-10-CM | POA: Diagnosis not present

## 2022-02-27 DIAGNOSIS — Z136 Encounter for screening for cardiovascular disorders: Secondary | ICD-10-CM | POA: Diagnosis not present

## 2022-02-27 DIAGNOSIS — I2510 Atherosclerotic heart disease of native coronary artery without angina pectoris: Secondary | ICD-10-CM | POA: Diagnosis not present

## 2022-02-27 DIAGNOSIS — N400 Enlarged prostate without lower urinary tract symptoms: Secondary | ICD-10-CM | POA: Diagnosis not present

## 2022-02-27 DIAGNOSIS — R739 Hyperglycemia, unspecified: Secondary | ICD-10-CM | POA: Diagnosis not present

## 2022-02-27 DIAGNOSIS — Z23 Encounter for immunization: Secondary | ICD-10-CM | POA: Diagnosis not present

## 2022-02-27 DIAGNOSIS — E782 Mixed hyperlipidemia: Secondary | ICD-10-CM | POA: Diagnosis not present

## 2022-02-27 DIAGNOSIS — I10 Essential (primary) hypertension: Secondary | ICD-10-CM | POA: Diagnosis not present

## 2022-02-27 DIAGNOSIS — E039 Hypothyroidism, unspecified: Secondary | ICD-10-CM | POA: Diagnosis not present

## 2022-02-27 DIAGNOSIS — Z0000 Encounter for general adult medical examination without abnormal findings: Secondary | ICD-10-CM | POA: Diagnosis not present

## 2022-02-27 DIAGNOSIS — E291 Testicular hypofunction: Secondary | ICD-10-CM | POA: Diagnosis not present

## 2022-02-27 DIAGNOSIS — E559 Vitamin D deficiency, unspecified: Secondary | ICD-10-CM | POA: Diagnosis not present

## 2022-03-02 ENCOUNTER — Other Ambulatory Visit: Payer: Self-pay | Admitting: Family Medicine

## 2022-03-02 DIAGNOSIS — Z136 Encounter for screening for cardiovascular disorders: Secondary | ICD-10-CM

## 2022-03-03 DIAGNOSIS — M170 Bilateral primary osteoarthritis of knee: Secondary | ICD-10-CM | POA: Diagnosis not present

## 2022-03-20 ENCOUNTER — Ambulatory Visit (INDEPENDENT_AMBULATORY_CARE_PROVIDER_SITE_OTHER): Payer: PPO

## 2022-03-20 DIAGNOSIS — Z136 Encounter for screening for cardiovascular disorders: Secondary | ICD-10-CM | POA: Diagnosis not present

## 2022-04-27 DIAGNOSIS — I10 Essential (primary) hypertension: Secondary | ICD-10-CM | POA: Diagnosis not present

## 2022-04-27 DIAGNOSIS — I214 Non-ST elevation (NSTEMI) myocardial infarction: Secondary | ICD-10-CM | POA: Diagnosis not present

## 2022-04-27 DIAGNOSIS — I2510 Atherosclerotic heart disease of native coronary artery without angina pectoris: Secondary | ICD-10-CM | POA: Diagnosis not present

## 2022-04-27 DIAGNOSIS — E785 Hyperlipidemia, unspecified: Secondary | ICD-10-CM | POA: Diagnosis not present

## 2022-04-27 DIAGNOSIS — Z955 Presence of coronary angioplasty implant and graft: Secondary | ICD-10-CM | POA: Diagnosis not present

## 2022-04-28 DIAGNOSIS — Z711 Person with feared health complaint in whom no diagnosis is made: Secondary | ICD-10-CM | POA: Diagnosis not present

## 2022-05-08 ENCOUNTER — Other Ambulatory Visit (HOSPITAL_COMMUNITY): Payer: Self-pay

## 2022-06-29 DIAGNOSIS — L309 Dermatitis, unspecified: Secondary | ICD-10-CM | POA: Diagnosis not present

## 2022-09-01 DIAGNOSIS — E890 Postprocedural hypothyroidism: Secondary | ICD-10-CM | POA: Diagnosis not present

## 2022-09-01 DIAGNOSIS — Z23 Encounter for immunization: Secondary | ICD-10-CM | POA: Diagnosis not present

## 2022-09-01 DIAGNOSIS — I10 Essential (primary) hypertension: Secondary | ICD-10-CM | POA: Diagnosis not present

## 2022-09-01 DIAGNOSIS — I2510 Atherosclerotic heart disease of native coronary artery without angina pectoris: Secondary | ICD-10-CM | POA: Diagnosis not present

## 2022-09-01 DIAGNOSIS — E559 Vitamin D deficiency, unspecified: Secondary | ICD-10-CM | POA: Diagnosis not present

## 2022-09-01 DIAGNOSIS — R7301 Impaired fasting glucose: Secondary | ICD-10-CM | POA: Diagnosis not present

## 2022-09-14 DIAGNOSIS — M25511 Pain in right shoulder: Secondary | ICD-10-CM | POA: Diagnosis not present

## 2022-09-30 DIAGNOSIS — M25511 Pain in right shoulder: Secondary | ICD-10-CM | POA: Diagnosis not present

## 2022-10-01 DIAGNOSIS — M25512 Pain in left shoulder: Secondary | ICD-10-CM | POA: Diagnosis not present

## 2022-10-19 DIAGNOSIS — M7122 Synovial cyst of popliteal space [Baker], left knee: Secondary | ICD-10-CM | POA: Diagnosis not present

## 2022-11-13 DIAGNOSIS — R7309 Other abnormal glucose: Secondary | ICD-10-CM | POA: Diagnosis not present

## 2022-11-13 DIAGNOSIS — I10 Essential (primary) hypertension: Secondary | ICD-10-CM | POA: Diagnosis not present

## 2022-11-13 DIAGNOSIS — E291 Testicular hypofunction: Secondary | ICD-10-CM | POA: Diagnosis not present

## 2022-11-13 DIAGNOSIS — N529 Male erectile dysfunction, unspecified: Secondary | ICD-10-CM | POA: Diagnosis not present

## 2022-11-13 DIAGNOSIS — E039 Hypothyroidism, unspecified: Secondary | ICD-10-CM | POA: Diagnosis not present

## 2022-11-13 DIAGNOSIS — K635 Polyp of colon: Secondary | ICD-10-CM | POA: Diagnosis not present

## 2022-11-13 DIAGNOSIS — I2510 Atherosclerotic heart disease of native coronary artery without angina pectoris: Secondary | ICD-10-CM | POA: Diagnosis not present

## 2022-11-13 DIAGNOSIS — E782 Mixed hyperlipidemia: Secondary | ICD-10-CM | POA: Diagnosis not present

## 2022-11-13 DIAGNOSIS — N400 Enlarged prostate without lower urinary tract symptoms: Secondary | ICD-10-CM | POA: Diagnosis not present

## 2022-12-22 IMAGING — US US ABDOMINAL AORTA SCREENING AAA
1 series · 14 of 14 positions shown · non-contrast
Comparison: CT abdomen done on 06/09/2010

CLINICAL DATA: Screening for abdominal aortic aneurysm

EXAM:
US ABDOMINAL AORTA MEDICARE SCREENING
TECHNIQUE: Ultrasound examination of the abdominal aorta was performed as a
screening evaluation for abdominal aortic aneurysm.

[Series 1: us aorta medicare screening · 14 of 14 slices shown]
[im 1/14]
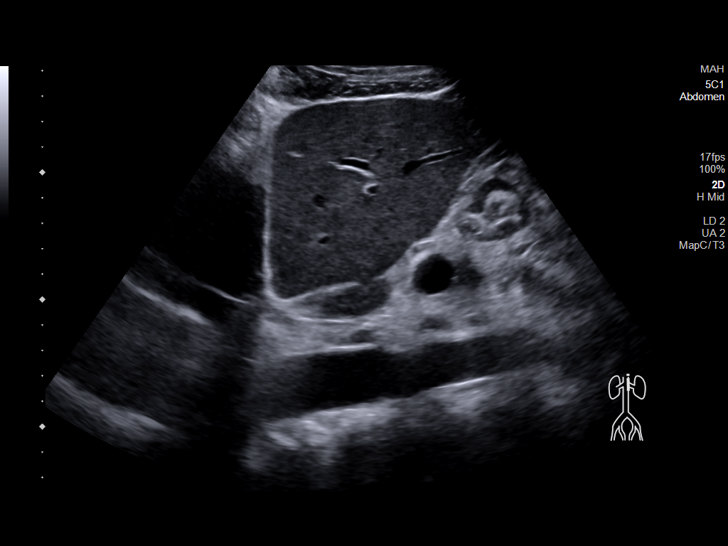
[im 2/14]
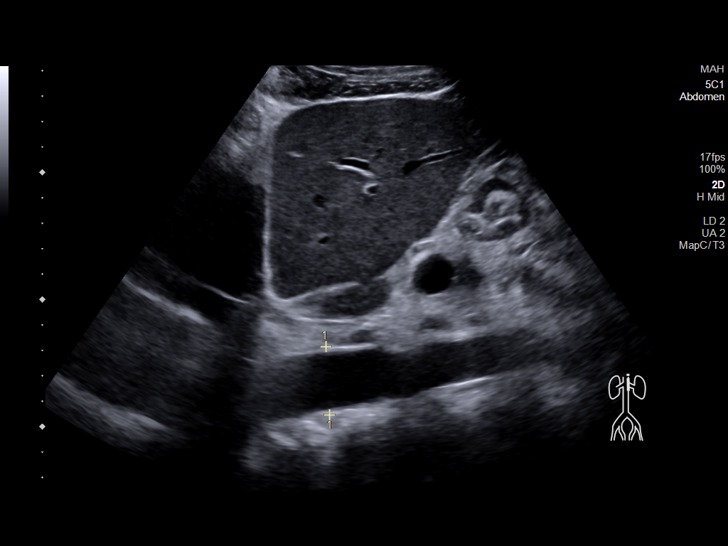
[im 3/14]
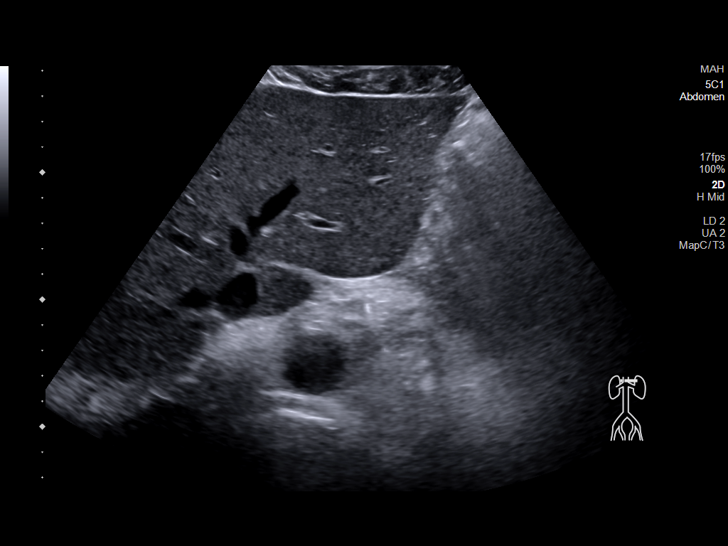
[im 4/14]
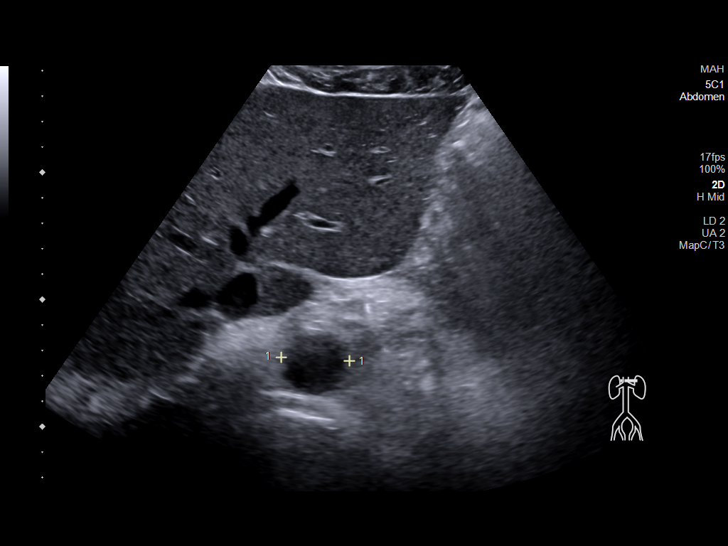
[im 5/14]
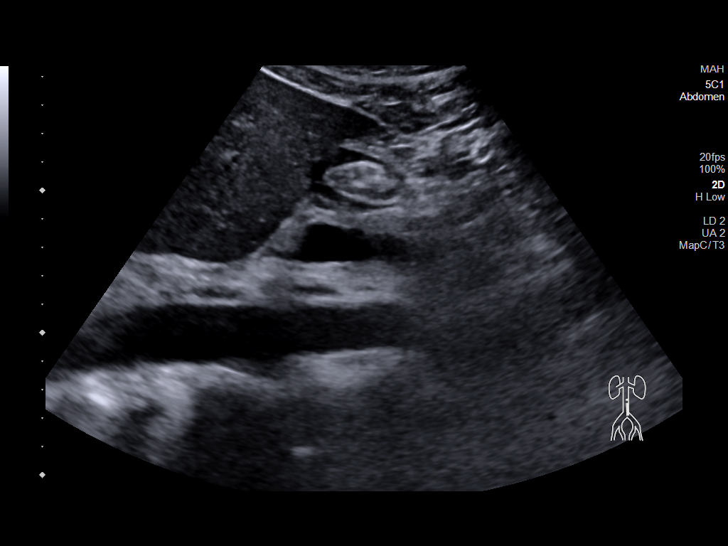
[im 6/14]
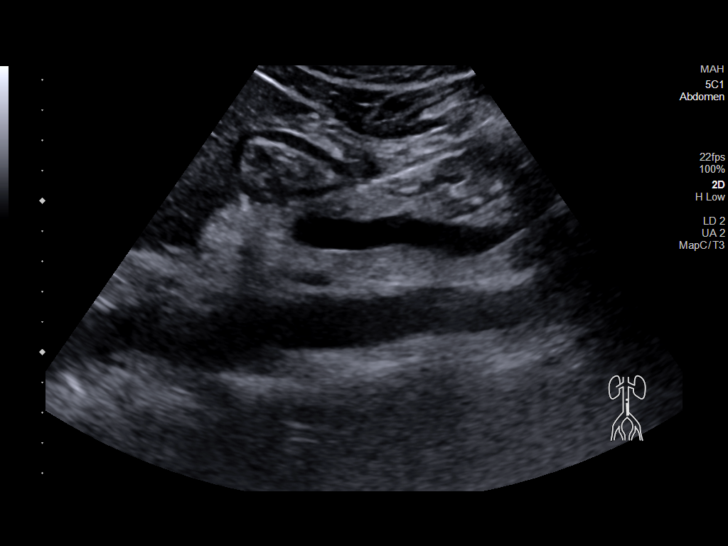
[im 7/14]
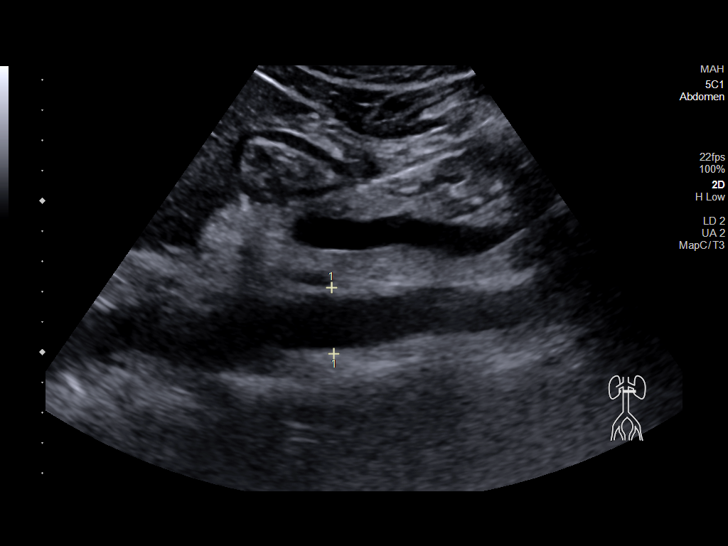
[im 8/14]
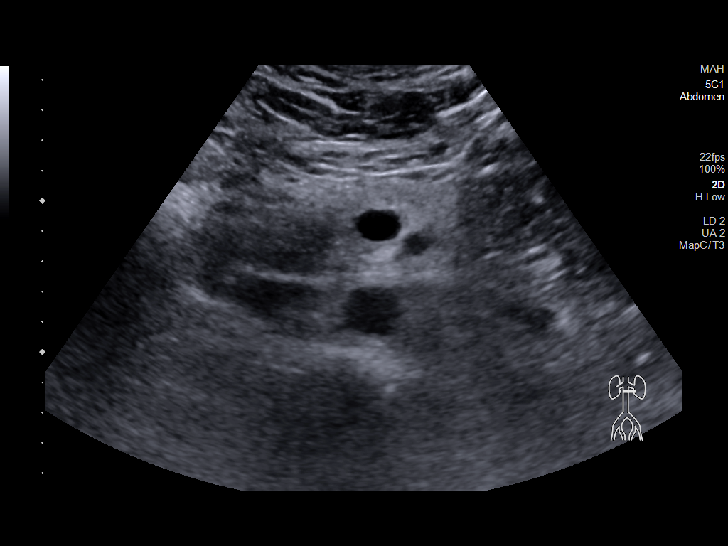
[im 9/14]
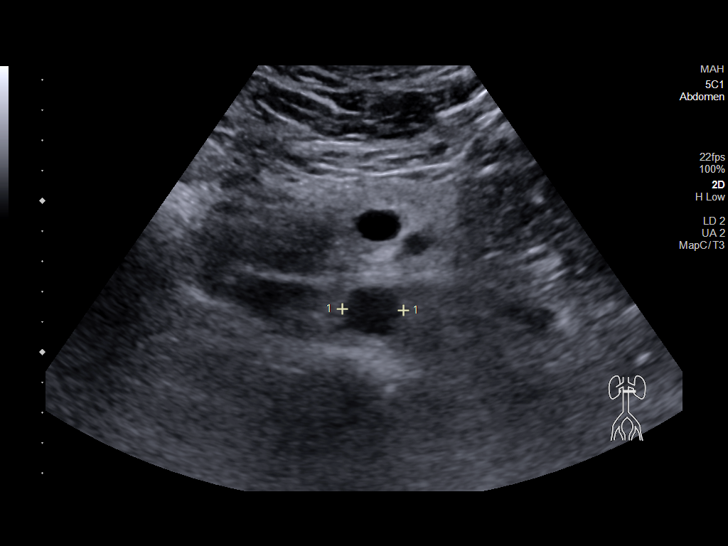
[im 10/14]
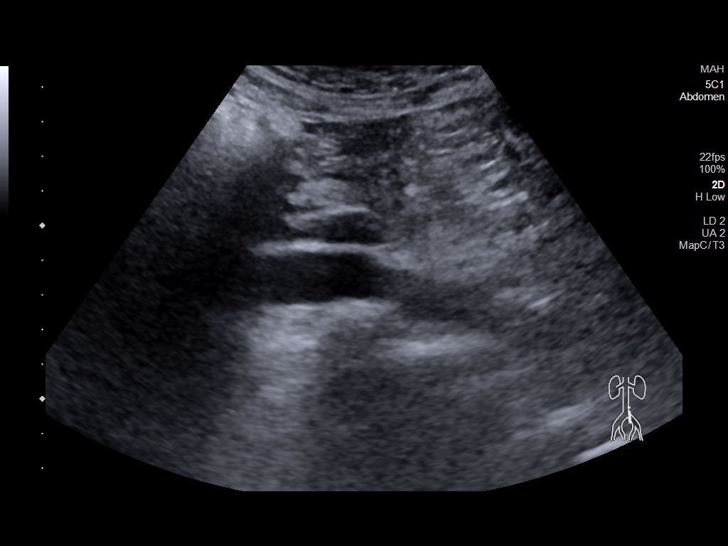
[im 11/14]
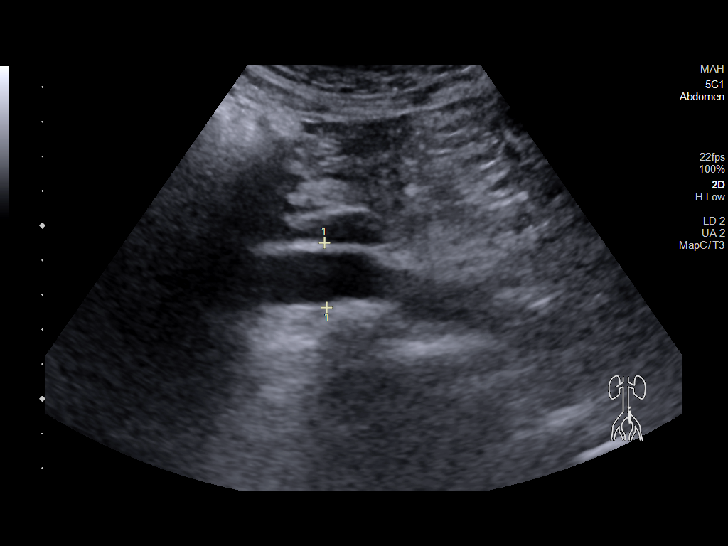
[im 12/14]
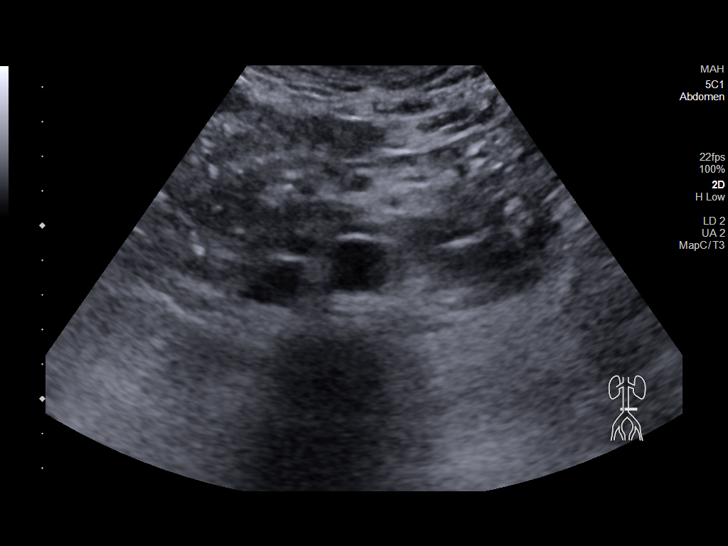
[im 13/14]
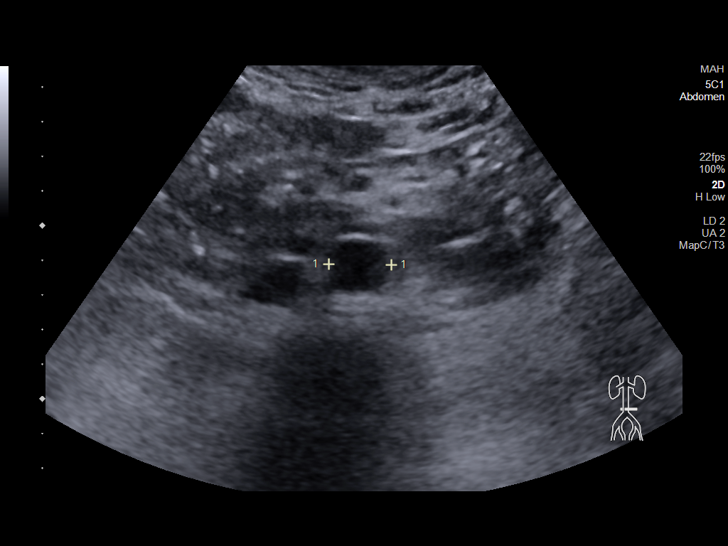
[im 14/14]
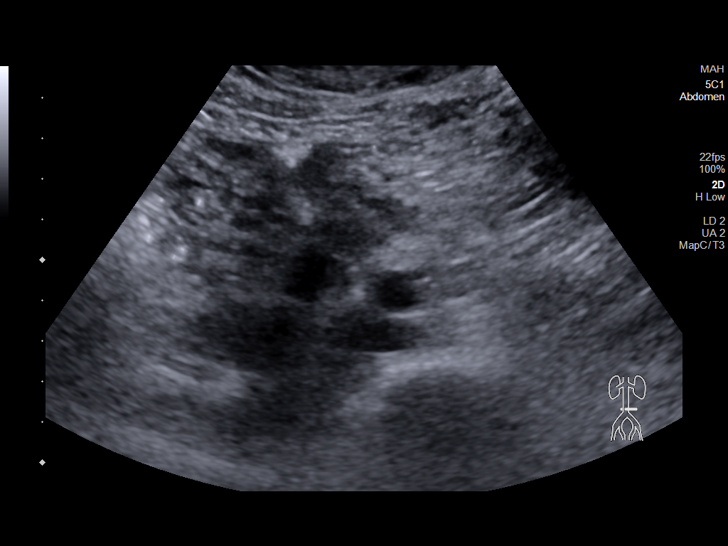

[14 of 14 positions shown; findings below may reference images not displayed]

FINDINGS: Abdominal aortic measurements as follows:

Proximal:  2.7 cm

Mid:  2.2 cm

Distal:  1.9 cm
IMPRESSION: There is no evidence of significant aneurysmal dilation of abdominal
aorta. Proximal abdominal aorta measures 2.7 cm in diameter.

Recommend follow-up every 5 years.Reference: [HOSPITAL]

## 2023-01-01 DIAGNOSIS — Z86010 Personal history of colonic polyps: Secondary | ICD-10-CM | POA: Diagnosis not present

## 2023-01-01 DIAGNOSIS — K5730 Diverticulosis of large intestine without perforation or abscess without bleeding: Secondary | ICD-10-CM | POA: Diagnosis not present

## 2023-01-01 DIAGNOSIS — K648 Other hemorrhoids: Secondary | ICD-10-CM | POA: Diagnosis not present

## 2023-01-01 DIAGNOSIS — Z09 Encounter for follow-up examination after completed treatment for conditions other than malignant neoplasm: Secondary | ICD-10-CM | POA: Diagnosis not present

## 2023-01-22 DIAGNOSIS — E291 Testicular hypofunction: Secondary | ICD-10-CM | POA: Diagnosis not present

## 2023-01-25 DIAGNOSIS — M7122 Synovial cyst of popliteal space [Baker], left knee: Secondary | ICD-10-CM | POA: Diagnosis not present

## 2023-01-25 DIAGNOSIS — M1712 Unilateral primary osteoarthritis, left knee: Secondary | ICD-10-CM | POA: Diagnosis not present

## 2023-04-16 DIAGNOSIS — J984 Other disorders of lung: Secondary | ICD-10-CM | POA: Diagnosis not present

## 2023-04-16 DIAGNOSIS — I2584 Coronary atherosclerosis due to calcified coronary lesion: Secondary | ICD-10-CM | POA: Diagnosis not present

## 2023-04-16 DIAGNOSIS — I10 Essential (primary) hypertension: Secondary | ICD-10-CM | POA: Diagnosis not present

## 2023-04-16 DIAGNOSIS — Z955 Presence of coronary angioplasty implant and graft: Secondary | ICD-10-CM | POA: Diagnosis not present

## 2023-04-16 DIAGNOSIS — I083 Combined rheumatic disorders of mitral, aortic and tricuspid valves: Secondary | ICD-10-CM | POA: Diagnosis not present

## 2023-04-16 DIAGNOSIS — I2510 Atherosclerotic heart disease of native coronary artery without angina pectoris: Secondary | ICD-10-CM | POA: Diagnosis not present

## 2023-04-16 DIAGNOSIS — R079 Chest pain, unspecified: Secondary | ICD-10-CM | POA: Diagnosis not present

## 2023-04-16 DIAGNOSIS — I252 Old myocardial infarction: Secondary | ICD-10-CM | POA: Diagnosis not present

## 2023-04-16 DIAGNOSIS — R7989 Other specified abnormal findings of blood chemistry: Secondary | ICD-10-CM | POA: Diagnosis not present

## 2023-04-16 DIAGNOSIS — E785 Hyperlipidemia, unspecified: Secondary | ICD-10-CM | POA: Diagnosis not present

## 2023-04-16 DIAGNOSIS — I214 Non-ST elevation (NSTEMI) myocardial infarction: Secondary | ICD-10-CM | POA: Diagnosis not present

## 2023-04-16 DIAGNOSIS — I169 Hypertensive crisis, unspecified: Secondary | ICD-10-CM | POA: Diagnosis not present

## 2023-04-16 DIAGNOSIS — I517 Cardiomegaly: Secondary | ICD-10-CM | POA: Diagnosis not present

## 2023-04-16 DIAGNOSIS — I25110 Atherosclerotic heart disease of native coronary artery with unstable angina pectoris: Secondary | ICD-10-CM | POA: Diagnosis not present

## 2023-04-16 DIAGNOSIS — R0602 Shortness of breath: Secondary | ICD-10-CM | POA: Diagnosis not present

## 2023-04-16 DIAGNOSIS — R0789 Other chest pain: Secondary | ICD-10-CM | POA: Diagnosis not present

## 2023-04-16 DIAGNOSIS — E039 Hypothyroidism, unspecified: Secondary | ICD-10-CM | POA: Diagnosis not present

## 2023-04-30 DIAGNOSIS — E785 Hyperlipidemia, unspecified: Secondary | ICD-10-CM | POA: Diagnosis not present

## 2023-04-30 DIAGNOSIS — I2510 Atherosclerotic heart disease of native coronary artery without angina pectoris: Secondary | ICD-10-CM | POA: Diagnosis not present

## 2023-04-30 DIAGNOSIS — I38 Endocarditis, valve unspecified: Secondary | ICD-10-CM | POA: Diagnosis not present

## 2023-04-30 DIAGNOSIS — I10 Essential (primary) hypertension: Secondary | ICD-10-CM | POA: Diagnosis not present

## 2023-04-30 DIAGNOSIS — Z1330 Encounter for screening examination for mental health and behavioral disorders, unspecified: Secondary | ICD-10-CM | POA: Diagnosis not present

## 2023-07-16 DIAGNOSIS — J40 Bronchitis, not specified as acute or chronic: Secondary | ICD-10-CM | POA: Diagnosis not present

## 2023-08-13 DIAGNOSIS — I10 Essential (primary) hypertension: Secondary | ICD-10-CM | POA: Diagnosis not present

## 2023-08-13 DIAGNOSIS — N400 Enlarged prostate without lower urinary tract symptoms: Secondary | ICD-10-CM | POA: Diagnosis not present

## 2023-08-13 DIAGNOSIS — I2510 Atherosclerotic heart disease of native coronary artery without angina pectoris: Secondary | ICD-10-CM | POA: Diagnosis not present

## 2023-08-13 DIAGNOSIS — N529 Male erectile dysfunction, unspecified: Secondary | ICD-10-CM | POA: Diagnosis not present

## 2023-08-13 DIAGNOSIS — I7300 Raynaud's syndrome without gangrene: Secondary | ICD-10-CM | POA: Diagnosis not present

## 2023-08-13 DIAGNOSIS — E039 Hypothyroidism, unspecified: Secondary | ICD-10-CM | POA: Diagnosis not present

## 2023-08-13 DIAGNOSIS — E559 Vitamin D deficiency, unspecified: Secondary | ICD-10-CM | POA: Diagnosis not present

## 2023-08-13 DIAGNOSIS — E782 Mixed hyperlipidemia: Secondary | ICD-10-CM | POA: Diagnosis not present

## 2023-08-13 DIAGNOSIS — M1991 Primary osteoarthritis, unspecified site: Secondary | ICD-10-CM | POA: Diagnosis not present

## 2023-08-13 DIAGNOSIS — E291 Testicular hypofunction: Secondary | ICD-10-CM | POA: Diagnosis not present

## 2023-08-13 DIAGNOSIS — Z0000 Encounter for general adult medical examination without abnormal findings: Secondary | ICD-10-CM | POA: Diagnosis not present

## 2023-08-13 DIAGNOSIS — M79671 Pain in right foot: Secondary | ICD-10-CM | POA: Diagnosis not present

## 2023-09-02 DIAGNOSIS — I10 Essential (primary) hypertension: Secondary | ICD-10-CM | POA: Diagnosis not present

## 2023-09-02 DIAGNOSIS — E1165 Type 2 diabetes mellitus with hyperglycemia: Secondary | ICD-10-CM | POA: Diagnosis not present

## 2023-09-02 DIAGNOSIS — R7301 Impaired fasting glucose: Secondary | ICD-10-CM | POA: Diagnosis not present

## 2023-09-02 DIAGNOSIS — E7800 Pure hypercholesterolemia, unspecified: Secondary | ICD-10-CM | POA: Diagnosis not present

## 2023-09-02 DIAGNOSIS — I2510 Atherosclerotic heart disease of native coronary artery without angina pectoris: Secondary | ICD-10-CM | POA: Diagnosis not present

## 2023-09-02 DIAGNOSIS — Z23 Encounter for immunization: Secondary | ICD-10-CM | POA: Diagnosis not present

## 2023-09-02 DIAGNOSIS — E890 Postprocedural hypothyroidism: Secondary | ICD-10-CM | POA: Diagnosis not present

## 2023-09-02 DIAGNOSIS — E559 Vitamin D deficiency, unspecified: Secondary | ICD-10-CM | POA: Diagnosis not present

## 2023-09-14 DIAGNOSIS — R053 Chronic cough: Secondary | ICD-10-CM | POA: Diagnosis not present

## 2024-02-25 DIAGNOSIS — E291 Testicular hypofunction: Secondary | ICD-10-CM | POA: Diagnosis not present

## 2024-02-25 DIAGNOSIS — D582 Other hemoglobinopathies: Secondary | ICD-10-CM | POA: Diagnosis not present

## 2024-04-04 DIAGNOSIS — J40 Bronchitis, not specified as acute or chronic: Secondary | ICD-10-CM | POA: Diagnosis not present

## 2024-04-04 DIAGNOSIS — E291 Testicular hypofunction: Secondary | ICD-10-CM | POA: Diagnosis not present

## 2024-04-18 DIAGNOSIS — R053 Chronic cough: Secondary | ICD-10-CM | POA: Diagnosis not present

## 2024-04-19 DIAGNOSIS — R058 Other specified cough: Secondary | ICD-10-CM | POA: Diagnosis not present

## 2024-05-11 DIAGNOSIS — Z1330 Encounter for screening examination for mental health and behavioral disorders, unspecified: Secondary | ICD-10-CM | POA: Diagnosis not present

## 2024-05-11 DIAGNOSIS — I214 Non-ST elevation (NSTEMI) myocardial infarction: Secondary | ICD-10-CM | POA: Diagnosis not present

## 2024-05-11 DIAGNOSIS — E782 Mixed hyperlipidemia: Secondary | ICD-10-CM | POA: Diagnosis not present

## 2024-05-11 DIAGNOSIS — I2510 Atherosclerotic heart disease of native coronary artery without angina pectoris: Secondary | ICD-10-CM | POA: Diagnosis not present

## 2024-06-09 DIAGNOSIS — E7800 Pure hypercholesterolemia, unspecified: Secondary | ICD-10-CM | POA: Diagnosis not present

## 2024-06-09 DIAGNOSIS — E291 Testicular hypofunction: Secondary | ICD-10-CM | POA: Diagnosis not present

## 2024-08-25 DIAGNOSIS — E782 Mixed hyperlipidemia: Secondary | ICD-10-CM | POA: Diagnosis not present

## 2024-08-25 DIAGNOSIS — Z125 Encounter for screening for malignant neoplasm of prostate: Secondary | ICD-10-CM | POA: Diagnosis not present

## 2024-08-25 DIAGNOSIS — K635 Polyp of colon: Secondary | ICD-10-CM | POA: Diagnosis not present

## 2024-08-25 DIAGNOSIS — E039 Hypothyroidism, unspecified: Secondary | ICD-10-CM | POA: Diagnosis not present

## 2024-08-25 DIAGNOSIS — B001 Herpesviral vesicular dermatitis: Secondary | ICD-10-CM | POA: Diagnosis not present

## 2024-08-25 DIAGNOSIS — G5602 Carpal tunnel syndrome, left upper limb: Secondary | ICD-10-CM | POA: Diagnosis not present

## 2024-08-25 DIAGNOSIS — N400 Enlarged prostate without lower urinary tract symptoms: Secondary | ICD-10-CM | POA: Diagnosis not present

## 2024-08-25 DIAGNOSIS — Z0000 Encounter for general adult medical examination without abnormal findings: Secondary | ICD-10-CM | POA: Diagnosis not present

## 2024-08-25 DIAGNOSIS — N529 Male erectile dysfunction, unspecified: Secondary | ICD-10-CM | POA: Diagnosis not present

## 2024-08-25 DIAGNOSIS — I7300 Raynaud's syndrome without gangrene: Secondary | ICD-10-CM | POA: Diagnosis not present

## 2024-08-25 DIAGNOSIS — I10 Essential (primary) hypertension: Secondary | ICD-10-CM | POA: Diagnosis not present

## 2024-08-25 DIAGNOSIS — Z136 Encounter for screening for cardiovascular disorders: Secondary | ICD-10-CM | POA: Diagnosis not present

## 2024-08-25 DIAGNOSIS — E291 Testicular hypofunction: Secondary | ICD-10-CM | POA: Diagnosis not present

## 2024-08-25 DIAGNOSIS — Z1331 Encounter for screening for depression: Secondary | ICD-10-CM | POA: Diagnosis not present

## 2024-09-01 DIAGNOSIS — E7800 Pure hypercholesterolemia, unspecified: Secondary | ICD-10-CM | POA: Diagnosis not present

## 2024-09-01 DIAGNOSIS — I10 Essential (primary) hypertension: Secondary | ICD-10-CM | POA: Diagnosis not present

## 2024-09-01 DIAGNOSIS — E890 Postprocedural hypothyroidism: Secondary | ICD-10-CM | POA: Diagnosis not present

## 2024-09-01 DIAGNOSIS — Z23 Encounter for immunization: Secondary | ICD-10-CM | POA: Diagnosis not present

## 2024-09-01 DIAGNOSIS — E559 Vitamin D deficiency, unspecified: Secondary | ICD-10-CM | POA: Diagnosis not present

## 2024-09-01 DIAGNOSIS — I2510 Atherosclerotic heart disease of native coronary artery without angina pectoris: Secondary | ICD-10-CM | POA: Diagnosis not present

## 2024-09-01 DIAGNOSIS — R7301 Impaired fasting glucose: Secondary | ICD-10-CM | POA: Diagnosis not present
